# Patient Record
Sex: Female | Born: 1965
Health system: Southern US, Community
[De-identification: ages and names within clinical notes are randomized; demographics above are authoritative.]

## PROBLEM LIST (undated history)

## (undated) DIAGNOSIS — Z01419 Encounter for gynecological examination (general) (routine) without abnormal findings: Secondary | ICD-10-CM

## (undated) DIAGNOSIS — Z13220 Encounter for screening for lipoid disorders: Secondary | ICD-10-CM

## (undated) DIAGNOSIS — Z9889 Other specified postprocedural states: Secondary | ICD-10-CM

## (undated) DIAGNOSIS — Z973 Presence of spectacles and contact lenses: Secondary | ICD-10-CM

## (undated) DIAGNOSIS — N6019 Diffuse cystic mastopathy of unspecified breast: Secondary | ICD-10-CM

## (undated) DIAGNOSIS — M1990 Unspecified osteoarthritis, unspecified site: Secondary | ICD-10-CM

## (undated) DIAGNOSIS — Z9289 Personal history of other medical treatment: Secondary | ICD-10-CM

## (undated) DIAGNOSIS — E559 Vitamin D deficiency, unspecified: Secondary | ICD-10-CM

## (undated) DIAGNOSIS — R112 Nausea with vomiting, unspecified: Secondary | ICD-10-CM

## (undated) DIAGNOSIS — N879 Dysplasia of cervix uteri, unspecified: Secondary | ICD-10-CM

## (undated) DIAGNOSIS — Z711 Person with feared health complaint in whom no diagnosis is made: Secondary | ICD-10-CM

## (undated) HISTORY — DX: Personal history of other medical treatment: Z92.89

## (undated) HISTORY — DX: Vitamin D deficiency, unspecified: E55.9

## (undated) HISTORY — DX: Encounter for screening for lipoid disorders: Z13.220

## (undated) HISTORY — PX: BREAST BIOPSY: SHX20

## (undated) HISTORY — DX: Encounter for gynecological examination (general) (routine) without abnormal findings: Z01.419

## (undated) HISTORY — DX: Person with feared health complaint in whom no diagnosis is made: Z71.1

## (undated) HISTORY — DX: Diffuse cystic mastopathy of unspecified breast: N60.19

---

## 1998-02-12 ENCOUNTER — Other Ambulatory Visit: Admission: RE | Admit: 1998-02-12 | Discharge: 1998-02-12 | Payer: Self-pay | Admitting: Internal Medicine

## 1998-08-30 ENCOUNTER — Other Ambulatory Visit: Admission: RE | Admit: 1998-08-30 | Discharge: 1998-08-30 | Payer: Self-pay | Admitting: Obstetrics and Gynecology

## 1999-03-22 ENCOUNTER — Inpatient Hospital Stay (HOSPITAL_COMMUNITY): Admission: AD | Admit: 1999-03-22 | Discharge: 1999-03-25 | Payer: Self-pay | Admitting: Obstetrics and Gynecology

## 1999-03-26 ENCOUNTER — Encounter (HOSPITAL_COMMUNITY): Admission: RE | Admit: 1999-03-26 | Discharge: 1999-06-24 | Payer: Self-pay | Admitting: Obstetrics and Gynecology

## 2001-07-27 ENCOUNTER — Inpatient Hospital Stay (HOSPITAL_COMMUNITY): Admission: AD | Admit: 2001-07-27 | Discharge: 2001-07-27 | Payer: Self-pay | Admitting: Obstetrics and Gynecology

## 2001-10-15 ENCOUNTER — Inpatient Hospital Stay (HOSPITAL_COMMUNITY): Admission: AD | Admit: 2001-10-15 | Discharge: 2001-10-17 | Payer: Self-pay | Admitting: Internal Medicine

## 2001-11-23 ENCOUNTER — Other Ambulatory Visit: Admission: RE | Admit: 2001-11-23 | Discharge: 2001-11-23 | Payer: Self-pay | Admitting: Obstetrics & Gynecology

## 2002-12-23 ENCOUNTER — Other Ambulatory Visit: Admission: RE | Admit: 2002-12-23 | Discharge: 2002-12-23 | Payer: Self-pay | Admitting: Obstetrics & Gynecology

## 2003-12-26 ENCOUNTER — Other Ambulatory Visit: Admission: RE | Admit: 2003-12-26 | Discharge: 2003-12-26 | Payer: Self-pay | Admitting: Obstetrics & Gynecology

## 2004-03-12 ENCOUNTER — Ambulatory Visit (HOSPITAL_COMMUNITY): Admission: RE | Admit: 2004-03-12 | Discharge: 2004-03-12 | Payer: Self-pay | Admitting: Obstetrics & Gynecology

## 2004-11-20 ENCOUNTER — Encounter: Admission: RE | Admit: 2004-11-20 | Discharge: 2004-11-20 | Payer: Self-pay | Admitting: Family Medicine

## 2005-01-27 ENCOUNTER — Other Ambulatory Visit: Admission: RE | Admit: 2005-01-27 | Discharge: 2005-01-27 | Payer: Self-pay | Admitting: Obstetrics & Gynecology

## 2005-08-18 HISTORY — PX: BREAST SURGERY: SHX581

## 2005-12-15 ENCOUNTER — Encounter: Admission: RE | Admit: 2005-12-15 | Discharge: 2005-12-15 | Payer: Self-pay | Admitting: Family Medicine

## 2005-12-23 ENCOUNTER — Encounter: Admission: RE | Admit: 2005-12-23 | Discharge: 2005-12-23 | Payer: Self-pay | Admitting: Family Medicine

## 2006-01-02 ENCOUNTER — Encounter: Admission: RE | Admit: 2006-01-02 | Discharge: 2006-01-02 | Payer: Self-pay | Admitting: Family Medicine

## 2006-01-06 ENCOUNTER — Encounter: Admission: RE | Admit: 2006-01-06 | Discharge: 2006-01-06 | Payer: Self-pay | Admitting: Family Medicine

## 2006-02-26 ENCOUNTER — Ambulatory Visit: Payer: Self-pay | Admitting: Family Medicine

## 2006-02-26 ENCOUNTER — Other Ambulatory Visit: Admission: RE | Admit: 2006-02-26 | Discharge: 2006-02-26 | Payer: Self-pay | Admitting: Family Medicine

## 2006-03-18 HISTORY — PX: BREAST LUMPECTOMY W/ NEEDLE LOCALIZATION: SHX1266

## 2006-03-30 ENCOUNTER — Encounter (INDEPENDENT_AMBULATORY_CARE_PROVIDER_SITE_OTHER): Payer: Self-pay | Admitting: Specialist

## 2006-03-30 ENCOUNTER — Encounter: Admission: RE | Admit: 2006-03-30 | Discharge: 2006-03-30 | Payer: Self-pay | Admitting: General Surgery

## 2006-03-30 ENCOUNTER — Ambulatory Visit (HOSPITAL_BASED_OUTPATIENT_CLINIC_OR_DEPARTMENT_OTHER): Admission: RE | Admit: 2006-03-30 | Discharge: 2006-03-30 | Payer: Self-pay | Admitting: General Surgery

## 2006-10-26 ENCOUNTER — Ambulatory Visit: Payer: Self-pay | Admitting: Family Medicine

## 2006-12-01 ENCOUNTER — Ambulatory Visit: Payer: Self-pay | Admitting: Family Medicine

## 2007-04-05 ENCOUNTER — Encounter: Admission: RE | Admit: 2007-04-05 | Discharge: 2007-04-05 | Payer: Self-pay | Admitting: Family Medicine

## 2007-05-26 ENCOUNTER — Ambulatory Visit: Payer: Self-pay | Admitting: Family Medicine

## 2007-09-24 ENCOUNTER — Ambulatory Visit: Payer: Self-pay | Admitting: Family Medicine

## 2008-04-06 ENCOUNTER — Encounter: Admission: RE | Admit: 2008-04-06 | Discharge: 2008-04-06 | Payer: Self-pay | Admitting: Family Medicine

## 2008-06-06 ENCOUNTER — Ambulatory Visit: Payer: Self-pay | Admitting: Family Medicine

## 2008-08-23 ENCOUNTER — Ambulatory Visit: Payer: Self-pay | Admitting: Family Medicine

## 2009-04-12 ENCOUNTER — Encounter: Admission: RE | Admit: 2009-04-12 | Discharge: 2009-04-12 | Payer: Self-pay | Admitting: Family Medicine

## 2009-07-25 ENCOUNTER — Ambulatory Visit: Payer: Self-pay | Admitting: Family Medicine

## 2009-12-18 ENCOUNTER — Ambulatory Visit: Payer: Self-pay | Admitting: Physician Assistant

## 2010-03-18 HISTORY — PX: DIAGNOSTIC MAMMOGRAM: HXRAD719

## 2010-04-15 ENCOUNTER — Encounter: Admission: RE | Admit: 2010-04-15 | Discharge: 2010-04-15 | Payer: Self-pay | Admitting: Family Medicine

## 2010-06-18 DIAGNOSIS — Z87410 Personal history of cervical dysplasia: Secondary | ICD-10-CM

## 2010-06-18 HISTORY — DX: Personal history of cervical dysplasia: Z87.410

## 2010-06-24 ENCOUNTER — Ambulatory Visit: Payer: Self-pay | Admitting: Physician Assistant

## 2010-07-15 ENCOUNTER — Ambulatory Visit: Payer: Self-pay | Admitting: Family Medicine

## 2010-09-05 ENCOUNTER — Ambulatory Visit
Admission: RE | Admit: 2010-09-05 | Discharge: 2010-09-05 | Payer: Self-pay | Source: Home / Self Care | Attending: Family Medicine | Admitting: Family Medicine

## 2010-09-08 ENCOUNTER — Encounter: Payer: Self-pay | Admitting: General Surgery

## 2011-01-03 NOTE — Op Note (Signed)
[name], Kristina Newton                ACCOUNT NO.:  [id number]   MEDICAL RECORD NO.:  [id number]          PATIENT TYPE:  AMB   LOCATION:  DSC                          FACILITY:  [facility]   PHYSICIAN:  Ollen Gross. Vernell Morgans, M.D. DATE OF BIRTH:  [birth year]   DATE OF PROCEDURE:  DATE OF DISCHARGE:                                 OPERATIVE REPORT   PREOPERATIVE DIAGNOSIS:  Right breast calcifications.   POSTOPERATIVE DIAGNOSIS:  Right breast calcifications.   PROCEDURES PERFORMED:  Right breast needle localized lumpectomy.   SURGEON:  Ollen Gross. Carolynne Edouard, M.D.   ANESTHESIA:  General via LMA.   PROCEDURE:  After informed consent was obtained the patient was brought to  the operating room and placed in the supine position on the operating room  table.  After adequate induction of general anesthesia the patient's right  breast was prepped with Betadine and draped in the usual sterile manner.  Earlier in the day the patient had undergone a wire localization procedure,  and the wire was entering the breast laterally at the level of the nipple.  A small radial incision was made from the edge of the areola to the wire  with a 15-blade knife.  This incision was carried down through the skin and  subcutaneous tissue sharply with the electrocautery.  A small circular area  of breast tissue around the tip of the wire was removed sharply with the  electrocautery.  This was then marked with the long stitch being lateral and  the short stitch being superior and sent initially to Radiology where x-rays  revealed the calcifications to be within the specimen.  The specimen was  then sent to pathology for further evaluation.  Hemostasis was achieved  using the Bovie electrocautery.  The wound was irrigated with copious  amounts of saline.  The wound was infiltrated with 0.25% Marcaine.  The deep  layer of the wound was then closed with interrupted 3-0 Vicryl stitches, and  the skin was closed with a running 4-0  Monocryl subcuticular stitch.  Benzoin, Steri-Strips and sterile dressings were applied.  The patient  tolerated the procedure well.  At the end of the case all needle, sponge and  instrument counts were correct.  The patient was then awakened and taken to  the recovery room in stable condition.      Ollen Gross. Vernell Morgans, M.D.  Electronically Signed     PST/MEDQ  D:  03/30/2006  T:  03/31/2006  Job:  [id number]

## 2011-01-14 ENCOUNTER — Ambulatory Visit (INDEPENDENT_AMBULATORY_CARE_PROVIDER_SITE_OTHER): Payer: BC Managed Care – PPO | Admitting: Medical

## 2011-01-14 ENCOUNTER — Encounter: Payer: Self-pay | Admitting: Medical

## 2011-01-14 DIAGNOSIS — Z0000 Encounter for general adult medical examination without abnormal findings: Secondary | ICD-10-CM

## 2011-01-14 LAB — CBC WITH DIFFERENTIAL/PLATELET
Basophils Absolute: 0 10*3/uL (ref 0.0–0.1)
Basophils Relative: 1 % (ref 0–1)
Eosinophils Absolute: 0.1 10*3/uL (ref 0.0–0.7)
Hemoglobin: 13.3 g/dL (ref 12.0–15.0)
Lymphocytes Relative: 26 % (ref 12–46)
Lymphs Abs: 1.1 10*3/uL (ref 0.7–4.0)
MCHC: 32.1 g/dL (ref 30.0–36.0)
Platelets: 282 10*3/uL (ref 150–400)
RBC: 4.44 MIL/uL (ref 3.87–5.11)
WBC: 4.4 10*3/uL (ref 4.0–10.5)

## 2011-01-14 LAB — LIPID PANEL
LDL Cholesterol: 140 mg/dL — ABNORMAL HIGH (ref 0–99)
Triglycerides: 92 mg/dL (ref <150)
VLDL: 18 mg/dL (ref 0–40)

## 2011-01-14 LAB — POCT URINALYSIS DIPSTICK
Blood, UA: NEGATIVE
Glucose, UA: NEGATIVE
Leukocytes, UA: NEGATIVE
Nitrite, UA: NEGATIVE
Urobilinogen, UA: NEGATIVE

## 2011-01-14 LAB — COMPREHENSIVE METABOLIC PANEL
Albumin: 4.6 g/dL (ref 3.5–5.2)
CO2: 30 mEq/L (ref 19–32)
Calcium: 9.7 mg/dL (ref 8.4–10.5)
Chloride: 102 mEq/L (ref 96–112)
Potassium: 4.3 mEq/L (ref 3.5–5.3)
Sodium: 138 mEq/L (ref 135–145)

## 2011-01-14 NOTE — Patient Instructions (Signed)

## 2011-01-14 NOTE — Progress Notes (Signed)
Subjective:   HPI  Kristina Newton is a 45 y.o. female who presents for physical.  In general she notes that she is eating mostly healthy, occasional splurging.  Has done marathons in the past, but hasn't been running as much lately.  Has hx/o elevated LDL, but wants to avoid medication if possible.  No other heart disease risk factors other than elevated LDL.  Otherwise been in normal state of health, no other c/o.   Past Medical History  Diagnosis Date  . Hyperlipidemia   . Vitamin D insufficiency   . Fibrocystic breast disease   . Concern about skin cancer without diagnosis     regular surveillance with dermatology, [facility]  . Gynecologic exam normal     sees Dr. Ilda Mori    Review of Systems Constitutional: denies fever, chills, sweats, unexpected weight change, anorexia, fatigue Allergy: negative; denies recent sneezing, itching, congestion Dermatology: denies changing moles, rash, lumps, new worrisome lesions ENT: no runny nose, ear pain, sore throat, hoarseness, sinus pain, teeth pain, tinnitus, hearing loss, epistaxis Cardiology: denies chest pain, palpitations, edema, orthopnea, paroxysmal nocturnal dyspnea Respiratory: denies cough, shortness of breath, dyspnea on exertion, wheezing, hemoptysis Gastroenterology: denies abdominal pain, nausea, vomiting, diarrhea, constipation, blood in stool, changes in bowel movement, dysphagia Hematology: denies bleeding or bruising problems Musculoskeletal: denies arthralgias, myalgias, joint swelling, back pain, neck pain, cramping, gait changes Ophthalmology: denies vision changes, eye redness, itching, discharge Urology: denies dysuria, difficulty urinating, hematuria, urinary frequency, urgency, incontinence Neurology: no headache, weakness, tingling, numbness, speech abnormality, memory loss, falls, dizziness Psychology: denies depressed mood, agitation, sleep problems     Objective:   Physical Exam  General  appearance: alert, no distress, WD/WN,white  female , looks younger than stated age Skin: scattered benign appearing macules of torso HEENT: normocephalic, conjunctiva/corneas normal, sclerae anicteric, PERRLA, EOMi, nares patent, no discharge or erythema, pharynx normal Oral cavity: MMM, tongue normal, teeth normal Neck: supple, no lymphadenopathy, no thyromegaly, no masses, normal ROM Chest: non tender, normal shape and expansion Heart: RRR, normal S1, S2, no murmurs Lungs: CTA bilaterally, no wheezes, rhonchi, or rales Abdomen: +bs, soft, non tender, non distended, no masses, no hepatomegaly, no splenomegaly, no bruits Back: non tender, normal ROM, no scoliosis Musculoskeletal: upper extremities non tender, no obvious deformity, normal ROM throughout, lower extremities non tender, no obvious deformity, normal ROM throughout Extremities: no edema, no cyanosis, no clubbing Pulses: 2+ symmetric, upper and lower extremities, normal cap refill Neurological: alert, oriented x 3, CN2-12 intact, strength normal upper extremities and lower extremities, sensation normal throughout, DTRs 2+ throughout, no cerebellar signs, gait normal Psychiatric: normal affect, behavior normal, pleasant  Breast and pelvic - deferred to gynecology   Assessment :    Encounter Diagnosis  Name Primary?  . General medical examination Yes      Plan:    Discussed healthy lifestyle, preventative care, handout given on well visit info.   She sees dermatology yearly for surveillance, sees gynecology regularly for preventative care.  We will call with lab results.

## 2011-01-16 ENCOUNTER — Telehealth: Payer: Self-pay | Admitting: Medical

## 2011-01-16 DIAGNOSIS — R748 Abnormal levels of other serum enzymes: Secondary | ICD-10-CM

## 2011-01-16 NOTE — Telephone Encounter (Signed)
I spoke to pt about her lab results.  CBC, UA, TSH, lytes, kidney function all normal.  LDL and total chol slightly elevated.  Her AST was elevated, but ALT normally.  She does drink some alcohol and takes Tylenol, but not in excess.  Advised she use alcohol in moderation, she will come by for nurse visit for liver profile in 73mo.

## 2011-03-12 ENCOUNTER — Other Ambulatory Visit: Payer: Self-pay | Admitting: Family Medicine

## 2011-03-12 DIAGNOSIS — Z1231 Encounter for screening mammogram for malignant neoplasm of breast: Secondary | ICD-10-CM

## 2011-04-18 ENCOUNTER — Ambulatory Visit
Admission: RE | Admit: 2011-04-18 | Discharge: 2011-04-18 | Disposition: A | Discharge status: Home / Self Care | Payer: BC Managed Care – PPO | Source: Ambulatory Visit | Attending: Family Medicine | Admitting: Family Medicine

## 2011-04-18 DIAGNOSIS — Z1231 Encounter for screening mammogram for malignant neoplasm of breast: Secondary | ICD-10-CM

## 2011-07-14 ENCOUNTER — Other Ambulatory Visit (INDEPENDENT_AMBULATORY_CARE_PROVIDER_SITE_OTHER): Payer: BC Managed Care – PPO

## 2011-07-14 DIAGNOSIS — Z23 Encounter for immunization: Secondary | ICD-10-CM

## 2011-07-14 DIAGNOSIS — Z111 Encounter for screening for respiratory tuberculosis: Secondary | ICD-10-CM

## 2011-07-14 MED ORDER — TUBERCULIN PPD 5 UNIT/0.1ML ID SOLN: 5.0000 [IU] | Freq: Once | INTRADERMAL | Status: DC

## 2011-10-13 ENCOUNTER — Ambulatory Visit (INDEPENDENT_AMBULATORY_CARE_PROVIDER_SITE_OTHER): Payer: BC Managed Care – PPO | Admitting: Medical

## 2011-10-13 ENCOUNTER — Encounter: Payer: Self-pay | Admitting: Medical

## 2011-10-13 ENCOUNTER — Encounter: Payer: Self-pay | Admitting: Internal Medicine

## 2011-10-13 VITALS — BP 122/70 | HR 60 | Temp 98.4°F | Resp 18 | Wt 120.0 lb

## 2011-10-13 DIAGNOSIS — R7401 Elevation of levels of liver transaminase levels: Secondary | ICD-10-CM

## 2011-10-13 DIAGNOSIS — R748 Abnormal levels of other serum enzymes: Secondary | ICD-10-CM

## 2011-10-13 DIAGNOSIS — R7989 Other specified abnormal findings of blood chemistry: Secondary | ICD-10-CM

## 2011-10-13 DIAGNOSIS — R0789 Other chest pain: Secondary | ICD-10-CM

## 2011-10-13 DIAGNOSIS — R799 Abnormal finding of blood chemistry, unspecified: Secondary | ICD-10-CM

## 2011-10-13 NOTE — Progress Notes (Signed)
Subjective: Here today with c/o chest pressure.  She is a very active woman, exercises about 4 days per week including Yoga, high intensity aerobics, and weights.  She notes this past few weeks with a few random episodes of chest discomfort under the sternum.  She felt the last episode this past Friday while watching TV at rest.  The discomfort lasts for hours without associated SOB, nausea, diaphoresis, or paresthesias.  The pain doesn't radiate.  She denies heartburn, regurgitation, belching, gas, jaw pain, arm pain, nausea or vomiting.  She also notes some vision changes in the past 4 months, progressively worsening.  Vision isn't as crisp as in the past.  Denies vision loss, floaters, or eye pain.  She notes hx/o Lasik 2000.   After experiencing the chest discomfort, she began researching the symptoms online, and has gotten somewhat panicky thinking about the chest discomfort.  She does note that she routinely eats tangerines, tomato based sauces such as pasta, and foods containing beans and onions.   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  Denies problems with BP in general.  She was seen here in the past few years for unrelated c/o, had a concern on EKG, and ultimately was seen by cardiology and had echocardiogram.  Everything came back normal per her report.    Past Medical History  Diagnosis Date  . Hyperlipidemia   . Vitamin D insufficiency   . Fibrocystic breast disease   . Concern about skin cancer without diagnosis     regular surveillance with dermatology, [facility]  . Gynecologic exam normal     sees Dr. Ilda Mori   ROS: Gen: no fever, chills, sweats, weight changes Skin: no rash HEENT: negative Heart: no CP, palpitations, edema Lungs: no SOB, wheezing, hemoptysis, no recent long travel, recent procedure or surgery, no stasis, no hx/o DVT/PE GI: no pain, NVD or constipation GU: negative Neuro: no headache, numbness,  tingling, weakness, speech changes, falls  Pertinent family history: Father with heart disease, but over 80yo, and diagnosed in later life.    Objective:   Physical Exam  Filed Vitals:   10/13/11 1351  BP: 122/70  Pulse: 60  Temp: 98.4 F (36.9 C)  Resp: 18    General appearance: alert, no distress, WD/WN, lean white female HEENT: normocephalic, sclerae anicteric, TMs pearly, nares patent, no discharge or erythema, pharynx normal Oral cavity: MMM, no lesions Neck: supple, no lymphadenopathy, no thyromegaly, no masses Chest wall nontender except lower sternum focally Heart: RRR, normal S1, S2, no murmurs Lungs: CTA bilaterally, no wheezes, rhonchi, or rales Abdomen: +bs, soft, non tender, non distended, no masses, no hepatomegaly, no splenomegaly Pulses: 2+ symmetric, upper and lower extremities, normal cap refill Ext: no edema, cyanosis or clubbing   Adult ECG Report  Indication: chest discomfort  Rate: 55 bpm  Rhythm: sinus bradycardia  QRS Axis: 58 degrees  PR Interval:  QRS Duration: 86ms  QTc:  Conduction Disturbances: none  Other Abnormalities: none  Patient's cardiac risk factors are: borderline hyperlipidemia.  EKG comparison: 05/2008  Narrative Interpretation: sinus bradycardia, otherwise normal and unchanged in any significant way from 2009 EKG    Assessment and Plan :    Encounter Diagnoses  Name Primary?  . Chest discomfort Yes  . Elevated liver enzymes   . Abnormal blood chemistry    Advised that her symptoms are atypical for cardiac origin, particularly in light of her good  health, exercise routine without symptoms.  She is eating/drinking things that are GERD triggers.  I advised she avoid trigger foods, begin her Pepcid or OTC Prilosec BID for the next few days and see if symptoms resolve.  If worse or not improving, call or return.  She signed records release to obtain prior echo and cardiology notes.  Call report 2-3 days.  Elevated  LFT and abnormal blood chemistry back in May on her physical.  She will return soon for labs.

## 2011-10-14 ENCOUNTER — Telehealth: Payer: Self-pay | Admitting: Family Medicine

## 2011-10-14 ENCOUNTER — Encounter: Payer: Self-pay | Admitting: Medical

## 2011-10-14 NOTE — Telephone Encounter (Signed)
Pt returned your call and would like you to call her back at [phone]

## 2011-10-15 ENCOUNTER — Telehealth: Payer: Self-pay | Admitting: Medical

## 2011-10-15 NOTE — Telephone Encounter (Signed)
I spoke to pt.  She used Zantac last night and the pain is all gone.    I spoke to her about lipids in the past.  She will consider Niacin or Fenofibrate or statin to lower her cardiac risk, c/t Vit D, and consider 81mg  baby aspirin daily.  She will return soon for f/u labs - ordered in system as standing order for liver and hgba1c

## 2011-10-15 NOTE — Telephone Encounter (Signed)
Message copied by Jac Canavan on Wed Oct 15, 2011  7:46 AM ------      Message from: Jac Canavan      Created: Tue Oct 14, 2011  7:49 AM       Call and make sure she lets [place] know in a few days if the Pepcid or OTC Prilosec helps the chest discomfort.  I want her to avoid the acidic and gas causing foods we discussed for the next week to make sure symptoms resolve.            I went back and looked in her chart regarding cholesterol and abnormal labs from 12/2010.   I need her to return at her convenience to recheck liver test and diabetic screen called the HgbA1C (orders are in computer standing).            Regarding her cholesterol - her total chol has been elevated on more than 1 occasion.  She had a special test to check her lipid particle size back in 12/2009. That test showed her small LDL particles to be less than optimal and vit D was low.  She is healthy, exercises regularly and is normal weight with normal BP here.  If she wanted to further reduce her heart disease risk, I would recommend she consider these 3 things - daily Vit D such as [phone] IU daily, consider starting either Niacin or Fibrate prescription drug to modify her particle size which would also bring down her LDL and triglycerides further, and consider a daily baby Aspirin 81mg  for heart health.  See what her thoughts are?

## 2012-05-10 ENCOUNTER — Other Ambulatory Visit: Payer: Self-pay | Admitting: Family Medicine

## 2012-05-10 DIAGNOSIS — Z1231 Encounter for screening mammogram for malignant neoplasm of breast: Secondary | ICD-10-CM

## 2012-05-27 ENCOUNTER — Ambulatory Visit
Admission: RE | Admit: 2012-05-27 | Discharge: 2012-05-27 | Disposition: A | Discharge status: Home / Self Care | Payer: BC Managed Care – PPO | Source: Ambulatory Visit | Attending: Family Medicine | Admitting: Family Medicine

## 2012-05-27 DIAGNOSIS — Z1231 Encounter for screening mammogram for malignant neoplasm of breast: Secondary | ICD-10-CM

## 2012-06-15 ENCOUNTER — Ambulatory Visit (INDEPENDENT_AMBULATORY_CARE_PROVIDER_SITE_OTHER): Payer: BC Managed Care – PPO | Admitting: Medical

## 2012-06-15 ENCOUNTER — Encounter: Payer: Self-pay | Admitting: Medical

## 2012-06-15 VITALS — BP 120/80 | HR 58 | Temp 98.0°F | Resp 16 | Ht 64.0 in | Wt 121.0 lb

## 2012-06-15 DIAGNOSIS — Z0000 Encounter for general adult medical examination without abnormal findings: Secondary | ICD-10-CM

## 2012-06-15 DIAGNOSIS — E785 Hyperlipidemia, unspecified: Secondary | ICD-10-CM

## 2012-06-15 DIAGNOSIS — E559 Vitamin D deficiency, unspecified: Secondary | ICD-10-CM

## 2012-06-15 NOTE — Progress Notes (Signed)
Subjective:   HPI  Kristina Newton is a 46 y.o. female who presents for physical.  In general she notes that she is eating healthy, exercises regularly.  Has hx/o elevated LDL but no other heart disease risk factors other than elevated LDL.  Otherwise been in normal state of health, no other c/o.  Sees gynecology yearly, eye doctor regularly, dentist regularly.  No particular c/o.    Allergies  Allergen Reactions  . Penicillins     No current outpatient prescriptions on file prior to visit.    Past Medical History  Diagnosis Date  . Hyperlipidemia   . Vitamin D insufficiency   . Fibrocystic breast disease   . Concern about skin cancer without diagnosis     regular surveillance with dermatology, [facility]  . Gynecologic exam normal     sees Dr. Ilda Mori  . History of mammogram 05/27/2012    Past Surgical History  Procedure Date  . Breast surgery 2007    breast biopsy  . Diagnostic mammogram 8/11    normal    Family History  Problem Relation Age of Onset  . Cancer Mother     basal cell cancer  . Cancer Father     basal cell cancer  . Hypertension Father   . Heart disease Father     arrythmia  . Depression Sister   . Stroke Neg Hx   . Diabetes Neg Hx     History   Social History  . Marital Status: Married    Spouse Name: N/A    Number of Children: N/A  . Years of Education: N/A   Occupational History  . Not on file.   Social History Main Topics  . Smoking status: Never Smoker   . Smokeless tobacco: Never Used  . Alcohol Use: 2.5 oz/week    5 drink(s) per week  . Drug Use: No  . 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 Not on file   Social History Narrative   Aerobics, Runner, broadcasting/film/video,  Married, 2 children, ages 88yo and 62yo.  Works in Airline pilot for [employer]    Reviewed their medical, surgical, family,  social, medication, and allergy history and updated chart as appropriate.   Review of Systems Constitutional: -fever, -chills, -sweats, -unexpected weight change, -anorexia, -fatigue Allergy: -sneezing, -itching, -congestion Dermatology: denies changing moles, rash, lumps, new worrisome lesions ENT: -runny nose, -ear pain, -sore throat, -hoarseness, -sinus pain, -teeth pain, -tinnitus, -hearing loss, -epistaxis Cardiology:  -chest pain, -palpitations, -edema, -orthopnea, -paroxysmal nocturnal dyspnea Respiratory: -cough, -shortness of breath, -dyspnea on exertion, -wheezing, -hemoptysis Gastroenterology: -abdominal pain, -nausea, -vomiting, -diarrhea, -constipation, -blood in stool, -changes in bowel movement, -dysphagia Hematology: -bleeding or bruising problems Musculoskeletal: -arthralgias, -myalgias, -joint swelling, -back pain, -neck pain, -cramping, -gait changes Ophthalmology: -vision changes, -eye redness, -itching, -discharge Urology: -dysuria, -difficulty urinating, -hematuria, -urinary frequency, -urgency, incontinence Neurology: -headache, -weakness, -tingling, -numbness, -speech abnormality, -memory loss, -falls, -dizziness Psychology:  -depressed mood, -agitation, -sleep problems     Objective:   Physical Exam  General appearance: alert, no distress, WD/WN,white  female , looks younger than stated age Skin: scattered benign appearing macules of torso HEENT: normocephalic, conjunctiva/corneas normal, sclerae anicteric, PERRLA, EOMi, nares patent, no discharge or erythema, pharynx normal Oral cavity: MMM, tongue normal, teeth normal Neck: supple, no lymphadenopathy, no thyromegaly, no masses, normal ROM,  no bruits Chest: non tender, normal shape and expansion Heart: RRR, normal S1, S2, no murmurs Lungs: CTA bilaterally, no wheezes, rhonchi, or rales Abdomen: +bs, soft, non tender, non distended, no masses, no hepatomegaly, no splenomegaly, no bruits Back: non tender, normal  ROM, no scoliosis Musculoskeletal: upper extremities non tender, no obvious deformity, normal ROM throughout, lower extremities non tender, no obvious deformity, normal ROM throughout Extremities: no edema, no cyanosis, no clubbing Pulses: 2+ symmetric, upper and lower extremities, normal cap refill Neurological: alert, oriented x 3, CN2-12 intact, strength normal upper extremities and lower extremities, sensation normal throughout, DTRs 2+ throughout, no cerebellar signs, gait normal Psychiatric: normal affect, behavior normal, pleasant  Breast and pelvic - deferred to gynecology   Assessment :    Encounter Diagnoses  Name Primary?  . Routine general medical examination at a health care facility Yes  . Hyperlipidemia   . Vitamin D deficiency      Plan:    Discussed healthy lifestyle, preventative care, handout given.   She sees dermatology yearly for surveillance, sees gynecology regularly for preventative care.  We will call with lab results.  She just had flu vaccine at work.    Hyperlipidemia - LDL particles elevated on prior lipo profile.  We will check a lipo profile today for comparison.  She is otherwise healthy without other risk factors.    Vit D deficiency - not currently on Vit D, but is taking MV.   Labs today.   F/u pending labs

## 2012-06-15 NOTE — Patient Instructions (Signed)

## 2012-06-16 LAB — CBC WITH DIFFERENTIAL/PLATELET
Lymphocytes Relative: 27 % (ref 12–46)
Lymphs Abs: 1.1 10*3/uL (ref 0.7–4.0)
MCH: 30.5 pg (ref 26.0–34.0)
MCHC: 33.1 g/dL (ref 30.0–36.0)
MCV: 92.1 fL (ref 78.0–100.0)
Monocytes Absolute: 0.4 10*3/uL (ref 0.1–1.0)
Neutrophils Relative %: 59 % (ref 43–77)

## 2012-06-16 LAB — POCT URINALYSIS DIPSTICK
Blood, UA: NEGATIVE
Glucose, UA: NEGATIVE
Ketones, UA: NEGATIVE
Spec Grav, UA: <=1.005
Urobilinogen, UA: NEGATIVE

## 2012-06-16 LAB — COMPREHENSIVE METABOLIC PANEL
ALT: 8 U/L (ref 0–35)
AST: 15 U/L (ref 0–37)
Creat: 0.99 mg/dL (ref 0.50–1.10)
Potassium: 4.4 mEq/L (ref 3.5–5.3)
Total Bilirubin: 0.4 mg/dL (ref 0.3–1.2)
Total Protein: 6.5 g/dL (ref 6.0–8.3)

## 2012-06-16 NOTE — Addendum Note (Signed)
Addended by: Janeice Robinson on: 06/16/2012 04:40 PM   Modules accepted: Orders

## 2012-06-17 LAB — NMR LIPOPROFILE WITH LIPIDS
Cholesterol, Total: 238 mg/dL — ABNORMAL HIGH (ref <200)
HDL-C: 59 mg/dL (ref >=40)
LDL Particle Number: 1625 nmol/L — ABNORMAL HIGH (ref <1000)
LDL Size: 21.4 nm (ref >20.5)
Triglycerides: 94 mg/dL (ref <150)

## 2012-06-23 ENCOUNTER — Other Ambulatory Visit: Payer: Self-pay | Admitting: Medical

## 2012-06-23 MED ORDER — SIMVASTATIN 10 MG PO TABS: 10.0000 mg | ORAL_TABLET | Freq: Every day | ORAL | Status: DC

## 2012-06-30 ENCOUNTER — Other Ambulatory Visit: Payer: BC Managed Care – PPO

## 2012-08-23 ENCOUNTER — Other Ambulatory Visit (INDEPENDENT_AMBULATORY_CARE_PROVIDER_SITE_OTHER): Payer: BC Managed Care – PPO

## 2012-08-23 DIAGNOSIS — Z111 Encounter for screening for respiratory tuberculosis: Secondary | ICD-10-CM

## 2012-08-25 LAB — TB SKIN TEST
Induration: 0 mm
TB Skin Test: NEGATIVE

## 2012-10-02 ENCOUNTER — Other Ambulatory Visit: Payer: Self-pay

## 2012-10-20 ENCOUNTER — Telehealth: Payer: Self-pay | Admitting: Family Medicine

## 2012-10-20 NOTE — Telephone Encounter (Signed)
I left a message on her voice mail about scheduling her follow up appointment for fasting labs. CLS

## 2012-10-20 NOTE — Telephone Encounter (Signed)
Message copied by Janeice Robinson on Wed Oct 20, 2012 10:33 AM ------      Message from: Aleen Campi, DAVID S      Created: Wed Oct 20, 2012  4:17 AM       I believe we started her on cholesterol lowering medication after the lipo profile back in October.  She is due for repeat fasting lipoprofile and liver tests at this time.  pls schedule f/u. ------

## 2013-04-28 ENCOUNTER — Other Ambulatory Visit: Payer: Self-pay

## 2013-04-28 DIAGNOSIS — Z1231 Encounter for screening mammogram for malignant neoplasm of breast: Secondary | ICD-10-CM

## 2013-05-30 ENCOUNTER — Ambulatory Visit
Admission: RE | Admit: 2013-05-30 | Discharge: 2013-05-30 | Disposition: A | Discharge status: Home / Self Care | Payer: BC Managed Care – PPO | Source: Ambulatory Visit

## 2013-05-30 DIAGNOSIS — Z1231 Encounter for screening mammogram for malignant neoplasm of breast: Secondary | ICD-10-CM

## 2013-06-23 ENCOUNTER — Other Ambulatory Visit: Payer: Self-pay

## 2013-07-04 ENCOUNTER — Encounter: Payer: Self-pay | Admitting: Medical

## 2013-07-04 ENCOUNTER — Ambulatory Visit (INDEPENDENT_AMBULATORY_CARE_PROVIDER_SITE_OTHER): Payer: BC Managed Care – PPO | Admitting: Medical

## 2013-07-04 VITALS — BP 92/70 | HR 62 | Temp 97.8°F | Resp 16 | Ht 64.0 in | Wt 113.0 lb

## 2013-07-04 DIAGNOSIS — E7800 Pure hypercholesterolemia, unspecified: Secondary | ICD-10-CM

## 2013-07-04 DIAGNOSIS — E559 Vitamin D deficiency, unspecified: Secondary | ICD-10-CM

## 2013-07-04 DIAGNOSIS — Z0000 Encounter for general adult medical examination without abnormal findings: Secondary | ICD-10-CM

## 2013-07-04 NOTE — Progress Notes (Signed)
Subjective:   HPI  Kristina Newton is a 47 y.o. female who presents for a complete physical.  Medical care team includes:  Dermatology, [facility]  Gynecology, Dr. Arlyce Dice  Dentist, Dr. Zigmund Gottron, PA for primary care   Preventative care: Last ophthalmology visit:N/A Last dental visit:YES DR. Vickki Muff Last colonoscopy:N/A Last mammogram:05/2013 Last gynecological exam:2013 AT [facility] Last EKG:09/2011 Last labs: last year  Prior vaccinations: TD or Tdap:08/2010 Influenza:05/2013 AT [facility] Pneumococcal:N/A Shingles/Zostavax:N/A  Advanced directive:N/A Health care power of attorney:N/.A Living will:N/A  Concerns: Hx/o elevated LDL particle numbers on prior NMRs.  Never started statin last year.  Is actively doing triathlon training most days per week.  Very active.  Eating healthy ,small frequent meals.  Is down to her goal weight.  Reviewed their medical, surgical, family, social, medication, and allergy history and updated chart as appropriate.  Past Medical History  Diagnosis Date  . Hyperlipidemia   . Vitamin D insufficiency   . Fibrocystic breast disease   . Concern about skin cancer without diagnosis     regular surveillance with dermatology, [facility]  . Gynecologic exam normal     sees Dr. Ilda Mori  . History of mammogram 05/27/2012    Past Surgical History  Procedure Laterality Date  . Breast surgery  2007    breast biopsy  . Diagnostic mammogram  8/11    normal    History   Social History  . Marital Status: Married    Spouse Name: N/A    Number of Children: N/A  . Years of Education: N/A   Occupational History  . Not on file.   Social History Main Topics  . Smoking status: Never Smoker   . Smokeless tobacco: Never Used  . Alcohol Use: 2.5 oz/week    5 drink(s) per week  . Drug Use: No  . 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 Not on file   Social History Narrative   Aerobics, strength training,  Married, 2 children, ages 49yo and 71yo.  Works in Airline pilot for [employer]    Family History  Problem Relation Age of Onset  . Cancer Mother     basal cell cancer  . Cancer Father     basal cell cancer  . Hypertension Father   . Heart disease Father     arrythmia  . Hyperlipidemia Father   . Depression Sister   . Stroke Neg Hx   . Diabetes Neg Hx     No current outpatient prescriptions on file.  Allergies  Allergen Reactions  . Penicillins     Review of Systems Constitutional: -fever, -chills, -sweats, -unexpected weight change, -decreased appetite, -fatigue Allergy: -sneezing, -itching, -congestion Dermatology: -changing moles, --rash, -lumps ENT: -runny nose, -ear pain, -sore throat, -hoarseness, -sinus pain, -teeth pain, - ringing in ears, -hearing loss, -nosebleeds Cardiology: -chest pain, -palpitations, -swelling, -difficulty breathing when lying flat, -waking up short of breath Respiratory: -cough, -shortness of breath, -difficulty breathing with exercise or exertion, -wheezing, -coughing up blood Gastroenterology: -abdominal pain, -nausea, -vomiting, -diarrhea, -constipation, -blood in stool, -changes in bowel movement, -difficulty swallowing or eating Hematology: -bleeding, -bruising  Musculoskeletal: -joint aches, -muscle aches, -joint swelling, -back pain, -neck pain, -cramping, -changes in gait Ophthalmology: denies vision changes, eye redness, itching, discharge Urology: -burning with urination, -difficulty urinating, -blood in urine, -  urinary frequency, -urgency, -incontinence Neurology: -headache, -weakness, -tingling, -numbness, -memory loss, -falls, -dizziness Psychology: -depressed mood, -agitation, -sleep problems     Objective:   Physical Exam  BP 92/70  Pulse 62  Temp(Src) 97.8 F (36.6 C) (Oral)  Resp 16   Ht 5\' 4"  (1.626 m)  Wt 113 lb (51.256 kg)  BMI 19.39 kg/m2  LMP 01/04/2011  General appearance: alert, no distress, WD/WN,white female, looks younger than stated age  Skin: scattered benign appearing macules of torso  HEENT: normocephalic, conjunctiva/corneas normal, sclerae anicteric, PERRLA, EOMi, nares patent, no discharge or erythema, pharynx normal  Oral cavity: MMM, tongue normal, teeth normal  Neck: supple, no lymphadenopathy, no thyromegaly, no masses, normal ROM, no bruits  Chest: non tender, normal shape and expansion  Heart: RRR, normal S1, S2, no murmurs  Lungs: CTA bilaterally, no wheezes, rhonchi, or rales  Abdomen: +bs, soft, non tender, non distended, no masses, no hepatomegaly, no splenomegaly, no bruits  Back: non tender, normal ROM, no scoliosis  Musculoskeletal: upper extremities non tender, no obvious deformity, normal ROM throughout, lower extremities non tender, no obvious deformity, normal ROM throughout  Extremities: no edema, no cyanosis, no clubbing  Pulses: 2+ symmetric, upper and lower extremities, normal cap refill  Neurological: alert, oriented x 3, CN2-12 intact, strength normal upper extremities and lower extremities, sensation normal throughout, DTRs 2+ throughout, no cerebellar signs, gait normal  Psychiatric: normal affect, behavior normal, pleasant  Breast and pelvic - deferred to gynecology    Assessment and Plan :    Encounter Diagnoses  Name Primary?  . Routine general medical examination at a health care facility Yes  . Pure hypercholesterolemia   . Unspecified vitamin D deficiency     Physical exam - discussed healthy lifestyle, diet, exercise, preventative care, vaccinations, and addressed their concerns.  Handout given. Pure hypercholesterolemia - [facility] today.  Pending labs, consider long term statin therapy. Vit D deficiency - advised she use OTC Vit D   Follow-up pending labs.

## 2013-07-05 LAB — VITAMIN D 25 HYDROXY (VIT D DEFICIENCY, FRACTURES): Vit D, 25-Hydroxy: 37 ng/mL (ref 30–89)

## 2013-07-27 ENCOUNTER — Telehealth: Payer: Self-pay | Admitting: Medical

## 2013-07-27 NOTE — Telephone Encounter (Signed)
Spoke to today. Advised of Shane's instructions

## 2013-07-27 NOTE — Telephone Encounter (Signed)
pls call and let Adileny know her lab booklet is ready for pickup (NMR lipoprofile).  She needs to read over it, and then we can discuss.  There are some specific recommendations in the booklet, but we can discuss options and how to proceed.

## 2013-08-25 ENCOUNTER — Encounter: Payer: Self-pay | Admitting: Medical

## 2013-09-06 ENCOUNTER — Other Ambulatory Visit (INDEPENDENT_AMBULATORY_CARE_PROVIDER_SITE_OTHER): Payer: BC Managed Care – PPO

## 2013-09-06 DIAGNOSIS — Z111 Encounter for screening for respiratory tuberculosis: Secondary | ICD-10-CM

## 2013-09-08 LAB — TB SKIN TEST
Induration: NEGATIVE mm
TB SKIN TEST: NEGATIVE

## 2014-05-08 ENCOUNTER — Other Ambulatory Visit: Payer: Self-pay

## 2014-05-08 DIAGNOSIS — Z1231 Encounter for screening mammogram for malignant neoplasm of breast: Secondary | ICD-10-CM

## 2014-06-06 ENCOUNTER — Ambulatory Visit
Admission: RE | Admit: 2014-06-06 | Discharge: 2014-06-06 | Disposition: A | Discharge status: Home / Self Care | Payer: BC Managed Care – PPO | Source: Ambulatory Visit

## 2014-06-06 DIAGNOSIS — Z1231 Encounter for screening mammogram for malignant neoplasm of breast: Secondary | ICD-10-CM

## 2014-06-20 ENCOUNTER — Encounter: Payer: Self-pay | Admitting: Internal Medicine

## 2014-07-10 ENCOUNTER — Other Ambulatory Visit: Payer: Self-pay | Admitting: Family Medicine

## 2014-07-10 ENCOUNTER — Ambulatory Visit (INDEPENDENT_AMBULATORY_CARE_PROVIDER_SITE_OTHER): Payer: BC Managed Care – PPO | Admitting: Medical

## 2014-07-10 ENCOUNTER — Encounter: Payer: Self-pay | Admitting: Medical

## 2014-07-10 VITALS — BP 92/70 | HR 44 | Temp 97.6°F | Resp 16 | Ht 64.0 in | Wt 116.0 lb

## 2014-07-10 DIAGNOSIS — Z1211 Encounter for screening for malignant neoplasm of colon: Secondary | ICD-10-CM

## 2014-07-10 DIAGNOSIS — Z0000 Encounter for general adult medical examination without abnormal findings: Secondary | ICD-10-CM

## 2014-07-10 DIAGNOSIS — Z13220 Encounter for screening for lipoid disorders: Secondary | ICD-10-CM

## 2014-07-10 DIAGNOSIS — E559 Vitamin D deficiency, unspecified: Secondary | ICD-10-CM

## 2014-07-10 LAB — POCT URINALYSIS DIPSTICK
Bilirubin, UA: NEGATIVE
Blood, UA: NEGATIVE
GLUCOSE UA: NEGATIVE
Ketones, UA: NEGATIVE
Leukocytes, UA: NEGATIVE
Nitrite, UA: NEGATIVE
PH UA: 6.5
Protein, UA: NEGATIVE
SPEC GRAV UA: 1.02
Urobilinogen, UA: NEGATIVE

## 2014-07-10 NOTE — Progress Notes (Signed)
Subjective:   HPI  Kristina Newton is a 48 y.o. female who presents for a complete physical.  Medical care team includes:  [facility], [facility]  Gynecology, Dr. Arlyce DiceKaplan  Dentist, Dr. Zigmund DanielWeston  Eye doctor - established 2014  Kristina CoveyShane Tysinger, PA for primary care   Preventative care: Last ophthalmology visit: within last 12 months, has contacts now Last dental visit:YES DR. Vickki MuffWESTON Last colonoscopy:N/A Last mammogram:05/2013 Last gynecological exam:2013 AT [facility] Last EKG:09/2011 Last labs: last year  Prior vaccinations: TD or Tdap:08/2010 Influenza:05/2014 [facility] Pneumococcal:N/A Shingles/Zostavax:N/A  Advanced directive:N/A Health care power of attorney:N/.A Living will:N/A  Concerns: Hx/o elevated LDL particle numbers on prior NMRs.  Is actively doing triathlon training most days per week.  Very active.  Eating healthy ,small frequent meals.  Is down to her goal weight.   Patient's cardiac risk factors are: family history of premature cardiovascular disease. Patient's risk factors for DVT/PE: none. Previous cardiac testing: electrocardiogram (ECG).   Reviewed their medical, surgical, family, social, medication, and allergy history and updated chart as appropriate.  Past Medical History  Diagnosis Date  . Vitamin D insufficiency   . Fibrocystic breast disease   . Concern about skin cancer without diagnosis     regular [facility], [facility]  . Gynecologic exam normal     sees Dr. Ilda Moriichard Kaplan  . History of mammogram 05/27/2012  . Lipid screening     NMR lipoprofile - hx/o elevated LDL particle number and abnormal other particle, improved from 2013 - 2015.  low risk, no indicated therapy    Past Surgical History  Procedure Laterality Date  . Breast surgery  2007    breast biopsy  . Diagnostic mammogram  8/11    normal    History   Social History  . Marital Status: Married    Spouse Name: N/A    Number of  Children: N/A  . Years of Education: N/A   Occupational History  . Not on file.   Social History Main Topics  . Smoking status: Never Smoker   . Smokeless tobacco: Never Used  . Alcohol Use: 2.5 oz/week    5 Not specified per week  . Drug Use: No  . 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 Not on file   Social History Narrative   Aerobics, Runner, broadcasting/film/videostrength training, triathlons as of 2014 ongoing; Married, 2 children, ages 50yo and 15yo Isabelle Course(Lydia, dance for 12 years/[employer]).  Works in Airline pilotsales for [employer]    Family History  Problem Relation Age of Onset  . Cancer Mother     basal cell cancer  . Cancer Father     basal cell cancer  . Hypertension Father   . Hyperlipidemia Father   . Heart disease Father     arrythmia, stent/CAD?  . Depression Sister   . Stroke Neg Hx   . Diabetes Neg Hx     No current outpatient prescriptions on file.  Allergies  Allergen Reactions  . Penicillins     Review of Systems Constitutional: -fever, -chills, -sweats, -unexpected weight change, -decreased appetite, -fatigue Allergy: -sneezing, -itching, -congestion Dermatology: -changing moles, --rash, -lumps ENT: -runny nose, -ear pain, -sore throat, -hoarseness, -sinus pain, -teeth pain, - ringing in ears, -hearing loss, -nosebleeds Cardiology: -chest pain, -palpitations, -swelling, -difficulty breathing when lying flat, -waking up short of  breath Respiratory: -cough, -shortness of breath, -difficulty breathing with exercise or exertion, -wheezing, -coughing up blood Gastroenterology: -abdominal pain, -nausea, -vomiting, -diarrhea, -constipation, -blood in stool, -changes in bowel movement, -difficulty swallowing or eating Hematology: -bleeding, -bruising  Musculoskeletal: -joint aches, -muscle aches, -joint swelling, -back pain, -neck pain, -cramping, -changes in  gait Ophthalmology: denies vision changes, eye redness, itching, discharge Urology: -burning with urination, -difficulty urinating, -blood in urine, -urinary frequency, -urgency, -incontinence Neurology: -headache, -weakness, -tingling, -numbness, -memory loss, -falls, -dizziness Psychology: -depressed mood, -agitation, -sleep problems     Objective:   Physical Exam  BP 92/70 mmHg  Pulse 44  Temp(Src) 97.6 F (36.4 C) (Oral)  Resp 16  Ht 5\' 4"  (1.626 m)  Wt 116 lb (52.617 kg)  BMI 19.90 kg/m2  LMP 01/04/2011  General appearance: alert, no distress, WD/WN,white female, looks younger than stated age  Skin: scattered benign appearing macules of torso  HEENT: normocephalic, conjunctiva/corneas normal, sclerae anicteric, PERRLA, EOMi, nares patent, no discharge or erythema, pharynx normal  Oral cavity: MMM, tongue normal, teeth normal  Neck: supple, no lymphadenopathy, no thyromegaly, no masses, normal ROM, no bruits  Chest: non tender, normal shape and expansion  Heart: RRR, normal S1, S2, no murmurs  Lungs: CTA bilaterally, no wheezes, rhonchi, or rales  Abdomen: +bs, soft, non tender, non distended, no masses, no hepatomegaly, no splenomegaly, no bruits  Back: non tender, normal ROM, no scoliosis  Musculoskeletal: upper extremities non tender, no obvious deformity, normal ROM throughout, lower extremities non tender, no obvious deformity, normal ROM throughout  Extremities: no edema, no cyanosis, no clubbing  Pulses: 2+ symmetric, upper and lower extremities, normal cap refill  Neurological: alert, oriented x 3, CN2-12 intact, strength normal upper extremities and lower extremities, sensation normal throughout, DTRs 2+ throughout, no cerebellar signs, gait normal  Psychiatric: normal affect, behavior normal, pleasant  Breast and pelvic - deferred to gynecology    Assessment and Plan :    Encounter Diagnoses  Name Primary?  . Encounter for health maintenance examination in  adult Yes  . Vitamin D deficiency   . Lipid screening   . Screen for colon cancer     Physical exam - discussed healthy lifestyle, diet, exercise, preventative care, vaccinations, and addressed their concerns.  Handout given. Lipid screen - reviewed her [facility] NMR Lipo profile from 2014.   Given her low cardiac risk, ASCVD calculated 10 year risk of under 1%, healthy lifestyle, and improvements from 2013 til now with diet/exercise, no major indication for statin or other, plus she declines medication anyhow.  Plan repeat NMR lipo profile in 1-2 years.  We will refer for first screening colonoscopy Vit D deficiency - advised she use OTC Vit D, lab today Follow-up pending labs.

## 2014-07-11 LAB — BASIC METABOLIC PANEL
BUN: 14 mg/dL (ref 6–23)
CHLORIDE: 102 meq/L (ref 96–112)
CO2: 30 meq/L (ref 19–32)
CREATININE: 0.91 mg/dL (ref 0.50–1.10)
Calcium: 9.2 mg/dL (ref 8.4–10.5)
Glucose, Bld: 97 mg/dL (ref 70–99)
Potassium: 4.1 mEq/L (ref 3.5–5.3)
Sodium: 138 mEq/L (ref 135–145)

## 2014-07-11 LAB — CBC
HCT: 40.6 % (ref 36.0–46.0)
HEMOGLOBIN: 13.1 g/dL (ref 12.0–15.0)
MCH: 30.5 pg (ref 26.0–34.0)
MCHC: 32.3 g/dL (ref 30.0–36.0)
MCV: 94.6 fL (ref 78.0–100.0)
MPV: 10.3 fL (ref 9.4–12.4)
Platelets: 243 10*3/uL (ref 150–400)
RBC: 4.29 MIL/uL (ref 3.87–5.11)
RDW: 14 % (ref 11.5–15.5)
WBC: 3.7 10*3/uL — ABNORMAL LOW (ref 4.0–10.5)

## 2014-07-11 LAB — VITAMIN D 25 HYDROXY (VIT D DEFICIENCY, FRACTURES): Vit D, 25-Hydroxy: 25 ng/mL — ABNORMAL LOW (ref 30–100)

## 2014-07-12 ENCOUNTER — Other Ambulatory Visit: Payer: Self-pay | Admitting: Medical

## 2014-07-12 MED ORDER — VITAMIN D (ERGOCALCIFEROL) 1.25 MG (50000 UNIT) PO CAPS: 50000.0000 [IU] | ORAL_CAPSULE | ORAL | Status: DC

## 2014-10-23 ENCOUNTER — Other Ambulatory Visit (INDEPENDENT_AMBULATORY_CARE_PROVIDER_SITE_OTHER): Payer: BLUE CROSS/BLUE SHIELD

## 2014-10-23 DIAGNOSIS — Z23 Encounter for immunization: Secondary | ICD-10-CM | POA: Diagnosis not present

## 2015-01-11 ENCOUNTER — Ambulatory Visit: Payer: BLUE CROSS/BLUE SHIELD | Admitting: Medical

## 2015-01-12 ENCOUNTER — Ambulatory Visit (INDEPENDENT_AMBULATORY_CARE_PROVIDER_SITE_OTHER): Payer: BLUE CROSS/BLUE SHIELD | Admitting: Family Medicine

## 2015-01-12 ENCOUNTER — Encounter: Payer: Self-pay | Admitting: Family Medicine

## 2015-01-12 VITALS — BP 110/70 | HR 60 | Temp 98.6°F | Ht 63.75 in | Wt 113.4 lb

## 2015-01-12 DIAGNOSIS — L237 Allergic contact dermatitis due to plants, except food: Secondary | ICD-10-CM

## 2015-01-12 DIAGNOSIS — M2540 Effusion, unspecified joint: Secondary | ICD-10-CM | POA: Diagnosis not present

## 2015-01-12 DIAGNOSIS — L03115 Cellulitis of right lower limb: Secondary | ICD-10-CM | POA: Diagnosis not present

## 2015-01-12 MED ORDER — METHYLPREDNISOLONE ACETATE 80 MG/ML IJ SUSP
80.0000 mg | Freq: Once | INTRAMUSCULAR | Status: AC
  Administered 2015-01-12: 80 mg via INTRAMUSCULAR

## 2015-01-12 NOTE — Patient Instructions (Signed)
  Cellulitis appears to be resolving. The area of swelling in the left upper thigh, given that is it itchy, and the skin otherwise appears normal (it isn't hard, no weeping of skin), I feel like it is more of an inflammation or reaction, rather than infection.  Unlike the area on the right calf--which was swollen, hard, redder--and that area is improving with the antibiotics.  Complete the course of Bactrim. If you have new rashes or new areas of joint swelling, please stop it and call to change the remainder of the course to a different antibiotic  (doxy or keflex--allergic to PCN).  Start taking antihistamine daily (either claritin or zyrtec).  We are giving you a steroid injection today that should help with inflammation and any itching.    Touch base with me over the next few days if things are changing, getting worse.  You can use benadryl at night, if needed to help with sleep.

## 2015-01-12 NOTE — Progress Notes (Signed)
Chief Complaint  Patient presents with  . Edema    started yesterday afternoon.    She first noticed rash consistent with poison ivy on 5/18, a few days after working in her yard.  She first noticed "itchy rash" on her lower legs and wrist.  [place] then [place], [place] 5[place].  Then she started having larger blisters (5/23-24), which have been oozing.  She went to [facility] on 5/25 where she was diagnosed with cellulitis in the right calf--there had been significant redness and swelling around an area of large blisters.  She was put on Bactrim, and since then the redness and swelling has decreased some.  She still has a lot of weeping from the blisters on the lower legs.  She had a linear patch of blisters on her right upper thigh (I saw these 2 nights ago), which has subsequently dried up. But now presents with increasing redness and swelling of the left medial thigh.  This area is very itchy  She didn't take Zyrtec yesterday (thought it kept her awake--turns out, she thinks it was the bactrim causing insomnia).    She has also noticed swelling in her left ankle yesterday afternoon.  Denies any trauma/injury.  It wasn't red, or warm, and there is no associated rash over that area (small bleb posteriorly, but without any redness or focal swelling). No ankle pain, just stiffness due to swelling.  She used a new soap/lotion that she feels caused a rash on her anterior arms. She has stopped using it, but has persistent mild rash which is slightly itchy. No rash elsewhere (none on torso, upper arms)  Rash on her anterior arms related to a new soap/lotion she used.  Still persistent and slightly itchy  PMH, PSH, SH reviewed.  Outpatient Encounter Prescriptions as of 01/12/2015  Medication Sig  . ibuprofen (ADVIL,MOTRIN) 200 MG tablet Take 400 mg by mouth every 6 (six) hours as needed.  . sulfamethoxazole-trimethoprim (BACTRIM DS,SEPTRA DS) 800-160 MG per tablet Take  1 tablet by mouth 2 (two) times daily.  . Vitamin D, Ergocalciferol, (DRISDOL) 50000 UNITS CAPS capsule Take 1 capsule (50,000 Units total) by mouth every 7 (seven) days. (Patient not taking: Reported on 01/12/2015)  . [EXPIRED] methylPREDNISolone acetate (DEPO-MEDROL) injection 80 mg    No facility-administered encounter medications on file as of 01/12/2015.  has also been using zyrtec for a couple of days, hasn't taken today.  ROS: no fever, chills, headache, nausea, vomiting, diarrhea, bleeding, abdominal pain, chest pain, shortness of breath.  No joint pains (just swelling in the ankle as per HPI).   PHYSICAL EXAM: BP 110/70 mmHg  Pulse 60  Temp(Src) 98.6 F (37 C) (Tympanic)  Ht 5' 3.75" (1.619 m)  Wt 113 lb 6.4 oz (51.438 kg)  BMI 19.62 kg/m2  LMP 01/04/2011  Well developed female, in no acute distress Skin: left medial thigh: 12 x 15cm area of mild soft tissue swelling and mild erythema (light pink, not angry red).  Within this area there is healing rash--slight flaking. No crusting.  Not indurated, it is soft. No streaks, nontender  She does have a papular rash on her right medial thigh, in a kissing distribution to the area that is swollen and red. These are not vesicular, and not weeping.  She has shaved since having the cellulitis, with the same razor, and there is mild diffuse folliculitis.   Right medial calf-- 8.5 x 4.5 cm area of erythema, with weeping.  There is minmal surrounding erythema.  This area was observed by myself 2 nights ago, and had further redness, induration and swelling, which has improved.  The wounds/blebs/ulcers have also improved since then.    She has scattered vesicles and blebs elsehwere on her lower legs--left posterior calf, and also medially closer to the knee.  She has lesions on the right medial ankle, and at both wrists, which are starting to heal.   Right ankle--has some mild diffuse swelling, most noticeable medially.  There is no erythema,  warmth, or overlying rash.  Slight decreased ROM  ASSESSMENT/PLAN:  Cellulitis of right lower extremity - Plan: methylPREDNISolone acetate (DEPO-MEDROL) injection 80 mg  Poison ivy dermatitis - Plan: methylPREDNISolone acetate (DEPO-MEDROL) injection 80 mg  Joint swelling  Cellulitis appears to be resolving with Bactrim. The area of swelling in the left upper thigh-- given that is it itchy, and the skin otherwise appears normal (it isn't hard, no weeping of skin), I feel like it is more of an inflammation or reaction, rather than infection.  Unlike the area on the right calf--which was swollen, hard, redder--and that area is improving with the antibiotics. Must consider the ankle swelling and this area to be possibly related--consider the possibility of reaction to Bactrim.  We discussed this, and she elects to proceed with continuing the Bactrim, but if new concerns develop (other areas of swelling, rash on her torso, etc) then she will stop and call to change antibiotic.  Risks/side effects of steroids were reviewed in detail.  Complete the course of Bactrim. If you have new rashes or new areas of joint swelling, please stop it and call to change the remainder of the course to a different antibiotic  (doxy or keflex--allergic to PCN).  Start taking antihistamine daily (either claritin or zyrtec).  We are giving you a steroid injection today that should help with inflammation and any itching.    Touch base with me over the next few days if things are changing, getting worse.

## 2015-01-13 ENCOUNTER — Encounter: Payer: Self-pay | Admitting: Family Medicine

## 2015-03-28 ENCOUNTER — Other Ambulatory Visit (INDEPENDENT_AMBULATORY_CARE_PROVIDER_SITE_OTHER): Payer: BLUE CROSS/BLUE SHIELD

## 2015-03-28 DIAGNOSIS — Z23 Encounter for immunization: Secondary | ICD-10-CM | POA: Diagnosis not present

## 2015-04-03 ENCOUNTER — Encounter: Payer: Self-pay | Admitting: Internal Medicine

## 2015-04-16 ENCOUNTER — Encounter: Payer: Self-pay | Admitting: Internal Medicine

## 2015-04-30 ENCOUNTER — Other Ambulatory Visit (INDEPENDENT_AMBULATORY_CARE_PROVIDER_SITE_OTHER): Payer: BLUE CROSS/BLUE SHIELD

## 2015-04-30 DIAGNOSIS — Z23 Encounter for immunization: Secondary | ICD-10-CM

## 2015-05-21 ENCOUNTER — Other Ambulatory Visit: Payer: Self-pay

## 2015-05-21 DIAGNOSIS — Z1231 Encounter for screening mammogram for malignant neoplasm of breast: Secondary | ICD-10-CM

## 2015-06-05 ENCOUNTER — Encounter: Payer: Self-pay | Admitting: Internal Medicine

## 2015-06-15 ENCOUNTER — Ambulatory Visit: Payer: Self-pay

## 2015-06-15 ENCOUNTER — Ambulatory Visit (AMBULATORY_SURGERY_CENTER): Payer: Self-pay

## 2015-06-15 VITALS — Ht 63.75 in | Wt 108.8 lb

## 2015-06-15 DIAGNOSIS — Z1211 Encounter for screening for malignant neoplasm of colon: Secondary | ICD-10-CM

## 2015-06-15 MED ORDER — SUPREP BOWEL PREP KIT 17.5-3.13-1.6 GM/177ML PO SOLN: 1.0000 | Freq: Once | ORAL | Status: DC

## 2015-06-15 NOTE — Progress Notes (Signed)
No allergies to eggs or soy No diet/weight loss meds No home oxygen No past problems with anesthesia  Has email  Emmi instructions given for colonoscopy 

## 2015-06-19 HISTORY — PX: COLONOSCOPY: SHX174

## 2015-06-21 ENCOUNTER — Ambulatory Visit
Admission: RE | Admit: 2015-06-21 | Discharge: 2015-06-21 | Disposition: A | Discharge status: Home / Self Care | Payer: BLUE CROSS/BLUE SHIELD | Source: Ambulatory Visit

## 2015-06-21 DIAGNOSIS — Z1231 Encounter for screening mammogram for malignant neoplasm of breast: Secondary | ICD-10-CM

## 2015-06-25 ENCOUNTER — Encounter: Payer: Self-pay | Admitting: Internal Medicine

## 2015-06-26 ENCOUNTER — Encounter: Payer: Self-pay | Admitting: Internal Medicine

## 2015-06-29 ENCOUNTER — Encounter: Payer: Self-pay | Admitting: Internal Medicine

## 2015-06-29 ENCOUNTER — Ambulatory Visit (AMBULATORY_SURGERY_CENTER): Payer: BLUE CROSS/BLUE SHIELD | Admitting: Internal Medicine

## 2015-06-29 VITALS — BP 115/73 | HR 42 | Temp 97.4°F | Resp 22 | Ht 63.75 in | Wt 108.0 lb

## 2015-06-29 DIAGNOSIS — Z1211 Encounter for screening for malignant neoplasm of colon: Secondary | ICD-10-CM | POA: Diagnosis not present

## 2015-06-29 MED ORDER — SODIUM CHLORIDE 0.9 % IV SOLN: 500.0000 mL | INTRAVENOUS | Status: DC

## 2015-06-29 NOTE — Op Note (Signed)
[facility] [address] [place], [id number]   COLONOSCOPY PROCEDURE REPORT  PATIENT: Kristina Newton, Kristina HearingSusan Newton  MR#: [id number] BIRTHDATE: 05-21-1966 , 49  yrs. old GENDER: female ENDOSCOPIST: Beverley FiedlerJay M Calden Dorsey, MD REFERRED AO:ZHYQBY:John Susann GivensLaLonde, M.D. PROCEDURE DATE:  06/29/2015 PROCEDURE:   Colonoscopy, screening First Screening Colonoscopy - Avg.  risk and is 50 yrs.  old or older Yes.  Prior Negative Screening - Now for repeat screening. N/A  History of Adenoma - Now for follow-up colonoscopy & has been > or = to 3 yrs.  N/A  Polyps removed today? No Polyps removed today? No Recommend repeat exam, <10 yrs? No ASA CLASS:   Class I INDICATIONS:Screening for colonic neoplasia, Colorectal Neoplasm Risk Assessment for this procedure is average risk, and 1st colonoscopy. MEDICATIONS: Monitored anesthesia care and Propofol 200 mg IV  DESCRIPTION OF PROCEDURE:   After the risks benefits and alternatives of the procedure were thoroughly explained, informed consent was obtained.  The digital rectal exam revealed no abnormalities of the rectum.   The LB [id number]-[id number]  endoscope was introduced through the anus and advanced to the cecum, which was identified by both the appendix and ileocecal valve. No adverse events experienced.   The quality of the prep was excellent. (Suprep was used)  The instrument was then slowly withdrawn as the colon was fully examined. Estimated blood loss is zero unless otherwise noted in this procedure report.      COLON FINDINGS: A normal appearing cecum, ileocecal valve, and appendiceal orifice were identified.  the ascending, transverse, descending, sigmoid colon, and rectum appeared unremarkable. Retroflexed views revealed no abnormalities. The time to cecum = 8.4 Withdrawal time = 7.8   The scope was withdrawn and the procedure completed.  COMPLICATIONS: There were no complications.  ENDOSCOPIC IMPRESSION: Normal  colonoscopy  RECOMMENDATIONS: Continue current colorectal screening recommendations for "routine risk" patients with a repeat colonoscopy in 10 years.  eSigned:  Beverley FiedlerJay M Sami Froh, MD 06/29/2015 9:51 AM   cc: Sharlot GowdaJohn LaLonde, MD and The Patient

## 2015-06-29 NOTE — Patient Instructions (Signed)
YOU HAD AN ENDOSCOPIC PROCEDURE TODAY AT THE [facility]:   Refer to the procedure report that was given to you for any specific questions about what was found during the examination.  If the procedure report does not answer your questions, please call your gastroenterologist to clarify.  If you requested that your care partner not be given the details of your procedure findings, then the procedure report has been included in a sealed envelope for you to review at your convenience later.  YOU SHOULD EXPECT: Some feelings of bloating in the abdomen. Passage of more gas than usual.  Walking can help get rid of the air that was put into your GI tract during the procedure and reduce the bloating. If you had a lower endoscopy (such as a colonoscopy or flexible sigmoidoscopy) you may notice spotting of blood in your stool or on the toilet paper. If you underwent a bowel prep for your procedure, you may not have a normal bowel movement for a few days.  Please Note:  You might notice some irritation and congestion in your nose or some drainage.  This is from the oxygen used during your procedure.  There is no need for concern and it should clear up in a day or so.  SYMPTOMS TO REPORT IMMEDIATELY:   Following lower endoscopy (colonoscopy or flexible sigmoidoscopy):  Excessive amounts of blood in the stool  Significant tenderness or worsening of abdominal pains  Swelling of the abdomen that is new, acute  Fever of 100F or higher   For urgent or emergent issues, a gastroenterologist can be reached at any hour by calling [phone].   DIET: Your first meal following the procedure should be a small meal and then it is ok to progress to your normal diet. Heavy or fried foods are harder to digest and may make you feel nauseous or bloated.  Likewise, meals heavy in dairy and vegetables can increase bloating.  Drink plenty of fluids but you should avoid alcoholic beverages for 24  hours.  ACTIVITY:  You should plan to take it easy for the rest of today and you should NOT DRIVE or use heavy machinery until tomorrow (because of the sedation medicines used during the test).    FOLLOW UP: Our staff will call the number listed on your records the next business day following your procedure to check on you and address any questions or concerns that you may have regarding the information given to you following your procedure. If we do not reach you, we will leave a message.  However, if you are feeling well and you are not experiencing any problems, there is no need to return our call.  We will assume that you have returned to your regular daily activities without incident.  If any biopsies were taken you will be contacted by phone or by letter within the next 1-3 weeks.  Please call us at [phone] if you have not heard about the biopsies in 3 weeks.    SIGNATURES/CONFIDENTIALITY: You and/or your care partner have signed paperwork which will be entered into your electronic medical record.  These signatures attest to the fact that that the information above on your After Visit Summary has been reviewed and is understood.  Full responsibility of the confidentiality of this discharge information lies with you and/or your care-partner. 

## 2015-06-29 NOTE — Progress Notes (Signed)
A/ox3, pleased with MAC, report to RN 

## 2015-07-02 ENCOUNTER — Telehealth: Payer: Self-pay | Admitting: *Deleted

## 2015-07-02 NOTE — Telephone Encounter (Signed)
  Follow up Call-  Call back number 06/29/2015  Post procedure Call Back phone  # [phone]  Permission to leave phone message Yes     Patient questions:  Do you have a fever, pain , or abdominal swelling? No. Pain Score  0 *  Have you tolerated food without any problems? Yes.    Have you been able to return to your normal activities? Yes.    Do you have any questions about your discharge instructions: Diet   No. Medications  No. Follow up visit  No.  Do you have questions or concerns about your Care? No.  Actions: * If pain score is 4 or above: No action needed, pain <4.

## 2016-02-05 ENCOUNTER — Ambulatory Visit (INDEPENDENT_AMBULATORY_CARE_PROVIDER_SITE_OTHER): Payer: BLUE CROSS/BLUE SHIELD | Admitting: Medical

## 2016-02-05 ENCOUNTER — Encounter: Payer: Self-pay | Admitting: Medical

## 2016-02-05 VITALS — BP 116/70 | HR 54 | Ht 63.75 in | Wt 111.0 lb

## 2016-02-05 DIAGNOSIS — S8992XA Unspecified injury of left lower leg, initial encounter: Secondary | ICD-10-CM | POA: Diagnosis not present

## 2016-02-05 DIAGNOSIS — R143 Flatulence: Secondary | ICD-10-CM

## 2016-02-05 DIAGNOSIS — Z0000 Encounter for general adult medical examination without abnormal findings: Secondary | ICD-10-CM

## 2016-02-05 DIAGNOSIS — M79605 Pain in left leg: Secondary | ICD-10-CM

## 2016-02-05 DIAGNOSIS — Z111 Encounter for screening for respiratory tuberculosis: Secondary | ICD-10-CM | POA: Diagnosis not present

## 2016-02-05 DIAGNOSIS — T148 Other injury of unspecified body region: Secondary | ICD-10-CM | POA: Diagnosis not present

## 2016-02-05 DIAGNOSIS — M21952 Unspecified acquired deformity of left thigh: Secondary | ICD-10-CM

## 2016-02-05 DIAGNOSIS — E559 Vitamin D deficiency, unspecified: Secondary | ICD-10-CM | POA: Diagnosis not present

## 2016-02-05 DIAGNOSIS — IMO0002 Reserved for concepts with insufficient information to code with codable children: Secondary | ICD-10-CM

## 2016-02-05 DIAGNOSIS — Z23 Encounter for immunization: Secondary | ICD-10-CM | POA: Diagnosis not present

## 2016-02-05 DIAGNOSIS — M79602 Pain in left arm: Secondary | ICD-10-CM

## 2016-02-05 DIAGNOSIS — G8929 Other chronic pain: Secondary | ICD-10-CM

## 2016-02-05 DIAGNOSIS — M21962 Unspecified acquired deformity of left lower leg: Secondary | ICD-10-CM

## 2016-02-05 DIAGNOSIS — M7072 Other bursitis of hip, left hip: Secondary | ICD-10-CM

## 2016-02-05 LAB — COMPREHENSIVE METABOLIC PANEL
ALBUMIN: 4.2 g/dL (ref 3.6–5.1)
ALT: 14 U/L (ref 6–29)
AST: 20 U/L (ref 10–35)
Alkaline Phosphatase: 35 U/L (ref 33–130)
BUN: 19 mg/dL (ref 7–25)
CHLORIDE: 103 mmol/L (ref 98–110)
CO2: 27 mmol/L (ref 20–31)
CREATININE: 1.22 mg/dL — AB (ref 0.50–1.05)
Calcium: 9.3 mg/dL (ref 8.6–10.4)
Glucose, Bld: 90 mg/dL (ref 65–99)
Potassium: 4.5 mmol/L (ref 3.5–5.3)
SODIUM: 139 mmol/L (ref 135–146)
Total Bilirubin: 0.5 mg/dL (ref 0.2–1.2)
Total Protein: 6.3 g/dL (ref 6.1–8.1)

## 2016-02-05 LAB — CBC
HEMATOCRIT: 40.1 % (ref 35.0–45.0)
Hemoglobin: 13 g/dL (ref 11.7–15.5)
MCH: 30.3 pg (ref 27.0–33.0)
MCHC: 32.4 g/dL (ref 32.0–36.0)
MCV: 93.5 fL (ref 80.0–100.0)
MPV: 10.1 fL (ref 7.5–12.5)
Platelets: 225 10*3/uL (ref 140–400)
RBC: 4.29 MIL/uL (ref 3.80–5.10)
RDW: 13.8 % (ref 11.0–15.0)
WBC: 4 10*3/uL (ref 4.0–10.5)

## 2016-02-05 LAB — POCT URINALYSIS DIPSTICK
Bilirubin, UA: NEGATIVE
Blood, UA: NEGATIVE
GLUCOSE UA: NEGATIVE
KETONES UA: NEGATIVE
LEUKOCYTES UA: NEGATIVE
Nitrite, UA: NEGATIVE
PROTEIN UA: NEGATIVE
SPEC GRAV UA: 1.02
Urobilinogen, UA: NEGATIVE
pH, UA: 6

## 2016-02-05 LAB — TSH: TSH: 1.81 mIU/L
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
## 2016-02-06 ENCOUNTER — Encounter: Payer: Self-pay | Admitting: Medical

## 2016-02-06 DIAGNOSIS — Z1159 Encounter for screening for other viral diseases: Secondary | ICD-10-CM | POA: Insufficient documentation

## 2016-02-06 DIAGNOSIS — IMO0002 Reserved for concepts with insufficient information to code with codable children: Secondary | ICD-10-CM | POA: Insufficient documentation

## 2016-02-06 DIAGNOSIS — Z111 Encounter for screening for respiratory tuberculosis: Secondary | ICD-10-CM | POA: Insufficient documentation

## 2016-02-06 DIAGNOSIS — Z1211 Encounter for screening for malignant neoplasm of colon: Secondary | ICD-10-CM | POA: Insufficient documentation

## 2016-02-06 DIAGNOSIS — Z23 Encounter for immunization: Secondary | ICD-10-CM | POA: Insufficient documentation

## 2016-02-06 DIAGNOSIS — E559 Vitamin D deficiency, unspecified: Secondary | ICD-10-CM | POA: Insufficient documentation

## 2016-02-06 DIAGNOSIS — R143 Flatulence: Secondary | ICD-10-CM | POA: Insufficient documentation

## 2016-02-06 DIAGNOSIS — S8992XA Unspecified injury of left lower leg, initial encounter: Secondary | ICD-10-CM | POA: Insufficient documentation

## 2016-02-06 DIAGNOSIS — Z0000 Encounter for general adult medical examination without abnormal findings: Principal | ICD-10-CM

## 2016-02-06 LAB — VITAMIN D 25 HYDROXY (VIT D DEFICIENCY, FRACTURES): Vit D, 25-Hydroxy: 33 ng/mL (ref 30–100)

## 2016-02-07 LAB — TB SKIN TEST
Induration: 0 mm
TB Skin Test: NEGATIVE

## 2016-02-11 ENCOUNTER — Telehealth: Payer: Self-pay | Admitting: Medical

## 2016-02-11 DIAGNOSIS — M7070 Other bursitis of hip, unspecified hip: Secondary | ICD-10-CM | POA: Insufficient documentation

## 2016-02-11 DIAGNOSIS — G8929 Other chronic pain: Secondary | ICD-10-CM | POA: Insufficient documentation

## 2016-02-11 DIAGNOSIS — M21959 Unspecified acquired deformity of unspecified thigh: Secondary | ICD-10-CM | POA: Insufficient documentation

## 2016-02-11 DIAGNOSIS — M79606 Pain in leg, unspecified: Secondary | ICD-10-CM

## 2016-02-11 NOTE — Telephone Encounter (Signed)
Called pt to inform that Alternative Vehicle Request Form has been completed. She advised that she did not want us to fax the form. She will pick the form up and fax it herself.

## 2016-02-12 NOTE — Addendum Note (Signed)
Addended by: Kieth BrightlyLAWSON, Leisha Trinkle M on: 02/12/2016 08:49 AM   Modules accepted: Kipp BroodSmartSet

## 2016-02-14 ENCOUNTER — Telehealth: Payer: Self-pay

## 2016-02-14 ENCOUNTER — Other Ambulatory Visit: Payer: Self-pay | Admitting: Medical

## 2016-02-14 ENCOUNTER — Ambulatory Visit
Admission: RE | Admit: 2016-02-14 | Discharge: 2016-02-14 | Disposition: A | Discharge status: Home / Self Care | Payer: BLUE CROSS/BLUE SHIELD | Source: Ambulatory Visit | Attending: Medical | Admitting: Medical

## 2016-02-14 ENCOUNTER — Telehealth: Payer: Self-pay | Admitting: Medical

## 2016-02-14 DIAGNOSIS — R7989 Other specified abnormal findings of blood chemistry: Secondary | ICD-10-CM

## 2016-02-14 DIAGNOSIS — R799 Abnormal finding of blood chemistry, unspecified: Secondary | ICD-10-CM

## 2016-02-14 DIAGNOSIS — M79605 Pain in left leg: Secondary | ICD-10-CM

## 2016-02-14 NOTE — Telephone Encounter (Signed)
Pt called to discuss labs, I never received her results. Went over what I seen in her chart. Pt said she has a lot of questions and would feel best if you called her to discuss all of this. She wants to check her insurance coverage with the xrays and is wanting to wait on seeing an orthopedist right now.

## 2016-02-14 NOTE — Telephone Encounter (Signed)
[facility] stated that if it is true trochanter bursitits they start with an injection. Normally that is not the only thing going on and they will take different methods to see what all the problems are. They do use the iontophoresis.

## 2016-02-14 NOTE — Telephone Encounter (Signed)
please call [facility] and ask to speak to the therapist personally to see what treatments they can do for trochanteric bursitis?  Also, do they do iontophoresis for this as well?

## 2016-06-02 ENCOUNTER — Other Ambulatory Visit: Payer: Self-pay | Admitting: Family Medicine

## 2016-06-02 DIAGNOSIS — Z1231 Encounter for screening mammogram for malignant neoplasm of breast: Secondary | ICD-10-CM

## 2016-06-18 HISTORY — PX: AUGMENTATION MAMMAPLASTY: SUR837

## 2016-06-23 ENCOUNTER — Ambulatory Visit
Admission: RE | Admit: 2016-06-23 | Discharge: 2016-06-23 | Disposition: A | Discharge status: Home / Self Care | Payer: BLUE CROSS/BLUE SHIELD | Source: Ambulatory Visit | Attending: Family Medicine | Admitting: Family Medicine

## 2016-06-23 DIAGNOSIS — Z1231 Encounter for screening mammogram for malignant neoplasm of breast: Secondary | ICD-10-CM

## 2016-08-18 DIAGNOSIS — R7989 Other specified abnormal findings of blood chemistry: Secondary | ICD-10-CM

## 2016-08-18 HISTORY — DX: Other specified abnormal findings of blood chemistry: R79.89

## 2016-08-18 HISTORY — PX: ANTERIOR CRUCIATE LIGAMENT REPAIR: SHX115

## 2017-01-26 ENCOUNTER — Other Ambulatory Visit: Payer: Self-pay | Admitting: Obstetrics & Gynecology

## 2017-06-11 ENCOUNTER — Other Ambulatory Visit: Payer: Self-pay | Admitting: Family Medicine

## 2017-06-11 DIAGNOSIS — Z1231 Encounter for screening mammogram for malignant neoplasm of breast: Secondary | ICD-10-CM

## 2017-07-02 ENCOUNTER — Ambulatory Visit: Payer: BLUE CROSS/BLUE SHIELD

## 2017-07-02 ENCOUNTER — Ambulatory Visit
Admission: RE | Admit: 2017-07-02 | Discharge: 2017-07-02 | Disposition: A | Discharge status: Home / Self Care | Payer: BLUE CROSS/BLUE SHIELD | Source: Ambulatory Visit | Attending: Family Medicine | Admitting: Family Medicine

## 2017-07-02 DIAGNOSIS — Z1231 Encounter for screening mammogram for malignant neoplasm of breast: Secondary | ICD-10-CM

## 2017-09-06 LAB — HM COLONOSCOPY

## 2017-11-30 ENCOUNTER — Ambulatory Visit (INDEPENDENT_AMBULATORY_CARE_PROVIDER_SITE_OTHER): Payer: BLUE CROSS/BLUE SHIELD | Admitting: Family Medicine

## 2017-11-30 ENCOUNTER — Encounter: Payer: Self-pay | Admitting: Family Medicine

## 2017-11-30 DIAGNOSIS — S161XXA Strain of muscle, fascia and tendon at neck level, initial encounter: Secondary | ICD-10-CM | POA: Diagnosis not present

## 2017-11-30 DIAGNOSIS — S90111A Contusion of right great toe without damage to nail, initial encounter: Secondary | ICD-10-CM | POA: Diagnosis not present
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
## 2018-03-24 DIAGNOSIS — Z113 Encounter for screening for infections with a predominantly sexual mode of transmission: Secondary | ICD-10-CM | POA: Diagnosis not present

## 2018-03-24 DIAGNOSIS — Z01419 Encounter for gynecological examination (general) (routine) without abnormal findings: Secondary | ICD-10-CM | POA: Diagnosis not present

## 2018-03-24 DIAGNOSIS — Z124 Encounter for screening for malignant neoplasm of cervix: Secondary | ICD-10-CM | POA: Diagnosis not present

## 2018-03-24 DIAGNOSIS — Z681 Body mass index (BMI) 19 or less, adult: Secondary | ICD-10-CM | POA: Diagnosis not present

## 2018-03-24 LAB — HM PAP SMEAR: HM Pap smear: POSITIVE

## 2018-04-14 DIAGNOSIS — R87619 Unspecified abnormal cytological findings in specimens from cervix uteri: Secondary | ICD-10-CM | POA: Diagnosis not present

## 2018-04-14 DIAGNOSIS — Z681 Body mass index (BMI) 19 or less, adult: Secondary | ICD-10-CM | POA: Diagnosis not present

## 2018-04-14 DIAGNOSIS — N870 Mild cervical dysplasia: Secondary | ICD-10-CM | POA: Diagnosis not present

## 2018-05-27 DIAGNOSIS — Z23 Encounter for immunization: Secondary | ICD-10-CM | POA: Diagnosis not present

## 2018-06-07 ENCOUNTER — Other Ambulatory Visit: Payer: Self-pay | Admitting: Family Medicine

## 2018-06-07 DIAGNOSIS — Z1231 Encounter for screening mammogram for malignant neoplasm of breast: Secondary | ICD-10-CM

## 2018-07-19 ENCOUNTER — Ambulatory Visit
Admission: RE | Admit: 2018-07-19 | Discharge: 2018-07-19 | Disposition: A | Discharge status: Home / Self Care | Payer: BLUE CROSS/BLUE SHIELD | Source: Ambulatory Visit | Attending: Family Medicine | Admitting: Family Medicine

## 2018-07-19 DIAGNOSIS — Z1231 Encounter for screening mammogram for malignant neoplasm of breast: Secondary | ICD-10-CM | POA: Diagnosis not present

## 2018-07-29 ENCOUNTER — Ambulatory Visit (INDEPENDENT_AMBULATORY_CARE_PROVIDER_SITE_OTHER): Payer: BLUE CROSS/BLUE SHIELD | Admitting: Family Medicine

## 2018-07-29 ENCOUNTER — Encounter: Payer: Self-pay | Admitting: Family Medicine

## 2018-07-29 VITALS — BP 100/60 | HR 56 | Temp 98.6°F | Ht 63.75 in | Wt 115.0 lb

## 2018-07-29 DIAGNOSIS — J069 Acute upper respiratory infection, unspecified: Secondary | ICD-10-CM | POA: Diagnosis not present

## 2018-07-29 DIAGNOSIS — N3090 Cystitis, unspecified without hematuria: Secondary | ICD-10-CM | POA: Diagnosis not present

## 2018-07-29 DIAGNOSIS — K219 Gastro-esophageal reflux disease without esophagitis: Secondary | ICD-10-CM

## 2018-07-29 DIAGNOSIS — J029 Acute pharyngitis, unspecified: Secondary | ICD-10-CM | POA: Diagnosis not present

## 2018-07-29 LAB — POCT RAPID STREP A (OFFICE): Rapid Strep A Screen: NEGATIVE

## 2018-07-29 NOTE — Patient Instructions (Addendum)
Use sudafed (decongestant) to help with the nasal congestion and postnasal drainage which is contributing to your sore throat. Continue salt water gargles, and use tylenol and/or ibuprofen as needed for pain. Reflux can contribute to sore throat, but you have drainage visible that is also a factor.  Please try and remember to get up and void shortly after intercourse to prevent urinary tract infection.  Cranberry juice or extract at first onset of symptoms can also sometimes help prevent infections.  Take Prilosec OTC once daily for the next 2 weeks.  Take it prior to dinner. After 2 weeks, if symptoms are improved, you can try taking it every other day, or just prior to meals which you know will trigger heartburn. Or you can switch back to Pepcid 20mg  before dinner (regularly or just as needed).    Food Choices for Gastroesophageal Reflux Disease, Adult When you have gastroesophageal reflux disease (GERD), the foods you eat and your eating habits are very important. Choosing the right foods can help ease the discomfort of GERD. Consider working with a diet and nutrition specialist (dietitian) to help you make healthy food choices. What general guidelines should I follow? Eating plan  Choose healthy foods low in fat, such as fruits, vegetables, whole grains, low-fat dairy products, and lean meat, fish, and poultry.  Eat frequent, small meals instead of three large meals each day. Eat your meals slowly, in a relaxed setting. Avoid bending over or lying down until 2-3 hours after eating.  Limit high-fat foods such as fatty meats or fried foods.  Limit your intake of oils, butter, and shortening to less than 8 teaspoons each day.  Avoid the following: ? Foods that cause symptoms. These may be different for different people. Keep a food diary to keep track of foods that cause symptoms. ? Alcohol. ? Drinking large amounts of liquid with meals. ? Eating meals during the 2-3 hours before  bed.  Cook foods using methods other than frying. This may include baking, grilling, or broiling. Lifestyle   Maintain a healthy weight. Ask your health care provider what weight is healthy for you. If you need to lose weight, work with your health care provider to do so safely.  Exercise for at least 30 minutes on 5 or more days each week, or as told by your health care provider.  Avoid wearing clothes that fit tightly around your waist and chest.  Do not use any products that contain nicotine or tobacco, such as cigarettes and e-cigarettes. If you need help quitting, ask your health care provider.  Sleep with the head of your bed raised. Use a wedge under the mattress or blocks under the bed frame to raise the head of the bed. What foods are not recommended? The items listed may not be a complete list. Talk with your dietitian about what dietary choices are best for you. Grains Pastries or quick breads with added fat. [place] toast. Vegetables Deep fried vegetables. [place] fries. Any vegetables prepared with added fat. Any vegetables that cause symptoms. For some people this may include tomatoes and tomato products, chili peppers, onions and garlic, and horseradish. Fruits Any fruits prepared with added fat. Any fruits that cause symptoms. For some people this may include citrus fruits, such as oranges, grapefruit, pineapple, and lemons. Meats and other protein foods High-fat meats, such as fatty beef or pork, hot dogs, ribs, ham, sausage, salami and bacon. Fried meat or protein, including fried fish and fried chicken. Nuts and nut butters.  Dairy Whole milk and chocolate milk. Sour cream. Cream. Ice cream. Cream cheese. Milk shakes. Beverages Coffee and tea, with or without caffeine. Carbonated beverages. Sodas. Energy drinks. Fruit juice made with acidic fruits (such as orange or grapefruit). Tomato juice. Alcoholic drinks. Fats and oils Butter. Margarine. Shortening. Ghee. Sweets  and desserts Chocolate and cocoa. Donuts. Seasoning and other foods Pepper. Peppermint and spearmint. Any condiments, herbs, or seasonings that cause symptoms. For some people, this may include curry, hot sauce, or vinegar-based salad dressings. Summary  When you have gastroesophageal reflux disease (GERD), food and lifestyle choices are very important to help ease the discomfort of GERD.  Eat frequent, small meals instead of three large meals each day. Eat your meals slowly, in a relaxed setting. Avoid bending over or lying down until 2-3 hours after eating.  Limit high-fat foods such as fatty meat or fried foods. This information is not intended to replace advice given to you by your health care provider. Make sure you discuss any questions you have with your health care provider. Document Released: 08/04/2005 Document Revised: 08/05/2016 Document Reviewed: 08/05/2016 Elsevier Interactive Patient Education  [employer].

## 2018-07-29 NOTE — Progress Notes (Signed)
Chief Complaint  Patient presents with  . Sore Throat    mostly on the left side. Started Sat am, going on 6 days. Hurts when she swallows, feels better when he drinks something cold. No fevers. Has not improved. Also is taking macrobid for UTI given by Dr.Horvath.    Left sided sore throat started 12/7, woke up at 4-5 am with it.  She took ibuprofen, ate, ran 1/2 marathon.  Throat discomfort hasn't gotten better, but hasn't gotten worse since then either. Ibuprofen helps some. Tried salt water gargle this morning, doesn't think she did it long enough. She denies runny nose, congestion, cough, fever.  She is having acid reflux when doing yoga (downward dog is when it is the worst, has had to come out of the pose due to reflux).  Did yoga on Sunday, Tuesday and yesterday.  Yesterday, prior to yoga [facility] which helped.  Denies significant changes in diet.  Drinks a strong coffee in the morning, 2 cups. Some lemonade recently.  Chews some peppermint gum Occasional wine/beer +chocolate  Occasionally wakes up at night with heartburn, about once a week.  New boyfriend, new sexual partner 2 am following intercourse this past weekend she had urinary urgency, frequency. She called her GYN, called in macrobid, took x 4 doses so far. Symptoms are resolving.   PMH, PSH, SH reviewed  Outpatient Encounter Medications as of 07/29/2018  Medication Sig Note  . Ibuprofen 200 MG CAPS Take 2 capsules by mouth.  07/29/2018: Last dose 1:30pm  . nitrofurantoin, macrocrystal-monohydrate, (MACROBID) 100 MG capsule Macrobid 100 mg capsule  Take 1 capsule twice a day by oral route for 7 days.   . famotidine (PEPCID) 20 MG tablet Take 20 mg by mouth daily.    No facility-administered encounter medications on file as of 07/29/2018.    Allergies  Allergen Reactions  . Benadryl [Diphenhydramine Hcl (Sleep)]     topical  . Poison Ivy Extract [Poison Ivy Extract]   . Penicillins Rash   ROS: no  fever, chills, headaches, dizziness, ear pain, cough, shortness of breath, chest pain. +sore throat, reflux and recent urinary symptoms per HPI.  No vaginal discharge, bleeding, rash or other concerns   PHYSICAL EXAM:  BP 100/60   Pulse (!) 56   Temp 98.6 F (37 C) (Tympanic)   Ht 5' 3.75" (1.619 m)   Wt 115 lb (52.2 kg)   LMP 12/30/2014   BMI 19.89 kg/m   Well developed, well-appearing, pleasant female in no distress HEENT: PERRL, EOMI, conjunctiva and sclera are clear. TM's and EAC's are normal.  Nasal mucosa--mod-severe edema with clear-white mucus Throat--white mucus noted posteriorly. Some erythema of L>R anterior tonsillar pillar. Tonsils are normal. Neck: Some tender anterior cervical lymphadenoapthy, more tender on the left than right. Heart: regular rate and rhythm Lungs: clear bilaterally Back: no CVA tenderness Abdomen: soft, nontender no oganomegaly or mass.  No epigastric or suprapubic tenderness Extremities: no edema Skin: normal turgor, no rash Psych: normal mood, affect, hygiene and grooming Neuro: alert and oriented, cranial nerves intact, normal gait  Rapid strep negative    ASSESSMENT/PLAN:  Sore throat - related to PND, from URI. GERD may also contribute, but suspect mostly from PND - Plan: Rapid Strep A  Viral upper respiratory infection - supportive measures reviewed  Gastroesophageal reflux disease without esophagitis - reflux precautions, diet and OTC meds reviewed in detail  Cystitis - resolving with ABX from GYN. Reminded to void after intercourse, consider cranberry  juice early. f/u if sx don't completely resolve   Use sudafed (decongestant) to help with the nasal congestion and postnasal drainage which is contributing to your sore throat. Continue salt water gargles, and use tylenol and/or ibuprofen as needed for pain. Reflux can contribute to sore throat, but you have drainage visible that is also a factor.   Take Prilosec OTC once daily  for the next 2 weeks.  Take it prior to dinner. After 2 weeks, if symptoms are improved, you can try taking it every other day, or just prior to meals which you know will trigger heartburn. Or you can switch back to Pepcid 20mg  before dinner (regularly or just as needed).

## 2018-08-18 DIAGNOSIS — E559 Vitamin D deficiency, unspecified: Secondary | ICD-10-CM

## 2018-08-18 HISTORY — DX: Vitamin D deficiency, unspecified: E55.9

## 2018-08-31 DIAGNOSIS — Z681 Body mass index (BMI) 19 or less, adult: Secondary | ICD-10-CM | POA: Diagnosis not present

## 2018-08-31 DIAGNOSIS — N760 Acute vaginitis: Secondary | ICD-10-CM | POA: Diagnosis not present

## 2019-01-19 DIAGNOSIS — N760 Acute vaginitis: Secondary | ICD-10-CM | POA: Diagnosis not present

## 2019-01-19 DIAGNOSIS — Z681 Body mass index (BMI) 19 or less, adult: Secondary | ICD-10-CM | POA: Diagnosis not present

## 2019-01-19 DIAGNOSIS — Z113 Encounter for screening for infections with a predominantly sexual mode of transmission: Secondary | ICD-10-CM | POA: Diagnosis not present

## 2019-01-19 DIAGNOSIS — R102 Pelvic and perineal pain: Secondary | ICD-10-CM | POA: Diagnosis not present

## 2019-02-15 DIAGNOSIS — A749 Chlamydial infection, unspecified: Secondary | ICD-10-CM | POA: Diagnosis not present

## 2019-02-15 DIAGNOSIS — Z681 Body mass index (BMI) 19 or less, adult: Secondary | ICD-10-CM | POA: Diagnosis not present

## 2019-02-17 DIAGNOSIS — A749 Chlamydial infection, unspecified: Secondary | ICD-10-CM | POA: Diagnosis not present

## 2019-04-22 DIAGNOSIS — Z681 Body mass index (BMI) 19 or less, adult: Secondary | ICD-10-CM | POA: Diagnosis not present

## 2019-04-22 DIAGNOSIS — Z124 Encounter for screening for malignant neoplasm of cervix: Secondary | ICD-10-CM | POA: Diagnosis not present

## 2019-04-22 DIAGNOSIS — Z01419 Encounter for gynecological examination (general) (routine) without abnormal findings: Secondary | ICD-10-CM | POA: Diagnosis not present

## 2019-04-27 LAB — HM PAP SMEAR: HM Pap smear: NEGATIVE

## 2019-04-28 ENCOUNTER — Encounter: Payer: Self-pay | Admitting: Medical

## 2019-05-05 ENCOUNTER — Telehealth: Payer: Self-pay | Admitting: Medical

## 2019-05-05 NOTE — Telephone Encounter (Signed)
Requested records received from [facility] °

## 2019-05-10 ENCOUNTER — Encounter: Payer: Self-pay | Admitting: Medical

## 2019-05-18 DIAGNOSIS — N870 Mild cervical dysplasia: Secondary | ICD-10-CM | POA: Diagnosis not present

## 2019-05-18 DIAGNOSIS — Z681 Body mass index (BMI) 19 or less, adult: Secondary | ICD-10-CM | POA: Diagnosis not present

## 2019-06-07 ENCOUNTER — Telehealth: Payer: Self-pay | Admitting: Medical

## 2019-06-07 NOTE — Telephone Encounter (Signed)
Pt called and Is coming in for Nov the 12th for her cpe and she would like to come in for fasting labs prior if that is ok, pt can be reached at [phone]

## 2019-06-08 ENCOUNTER — Other Ambulatory Visit: Payer: Self-pay | Admitting: Medical

## 2019-06-08 DIAGNOSIS — E559 Vitamin D deficiency, unspecified: Secondary | ICD-10-CM

## 2019-06-08 DIAGNOSIS — Z1159 Encounter for screening for other viral diseases: Secondary | ICD-10-CM

## 2019-06-08 DIAGNOSIS — Z0000 Encounter for general adult medical examination without abnormal findings: Secondary | ICD-10-CM

## 2019-06-08 DIAGNOSIS — Z8249 Family history of ischemic heart disease and other diseases of the circulatory system: Secondary | ICD-10-CM | POA: Insufficient documentation

## 2019-06-08 NOTE — Telephone Encounter (Signed)
Pt said no to STD she has that done at her OBGYN and ok to the Vit d and she would like a CRP she would like to everything that can be tested, she is wanting all the blood work that can be done

## 2019-06-08 NOTE — Telephone Encounter (Signed)
Labs are in

## 2019-06-08 NOTE — Telephone Encounter (Signed)
She said she wanted everything checked, not for sure about the STD panel, I think she did mention the VIT D

## 2019-06-08 NOTE — Telephone Encounter (Signed)
I got her scheduled for Nov the 9th

## 2019-06-08 NOTE — Telephone Encounter (Signed)
So with "everything", did she specifically say yes to STD screen or No to STD screen?

## 2019-06-08 NOTE — Telephone Encounter (Signed)
That is fine.  Typically I would order comprehensive metabolic panel, CBC blood count, lipid panel cholesterol.  Are there other labs that she would want to see, such as vitamin D level, STD screen, other?

## 2019-06-20 ENCOUNTER — Other Ambulatory Visit: Payer: BC Managed Care – PPO

## 2019-06-20 ENCOUNTER — Other Ambulatory Visit: Payer: Self-pay

## 2019-06-20 DIAGNOSIS — Z1159 Encounter for screening for other viral diseases: Secondary | ICD-10-CM

## 2019-06-20 DIAGNOSIS — Z8249 Family history of ischemic heart disease and other diseases of the circulatory system: Secondary | ICD-10-CM | POA: Diagnosis not present

## 2019-06-20 DIAGNOSIS — E559 Vitamin D deficiency, unspecified: Secondary | ICD-10-CM | POA: Diagnosis not present

## 2019-06-20 DIAGNOSIS — R945 Abnormal results of liver function studies: Secondary | ICD-10-CM | POA: Diagnosis not present

## 2019-06-20 DIAGNOSIS — Z0000 Encounter for general adult medical examination without abnormal findings: Secondary | ICD-10-CM

## 2019-06-20 DIAGNOSIS — E7800 Pure hypercholesterolemia, unspecified: Secondary | ICD-10-CM | POA: Diagnosis not present

## 2019-06-21 ENCOUNTER — Other Ambulatory Visit: Payer: BC Managed Care – PPO

## 2019-06-21 ENCOUNTER — Other Ambulatory Visit: Payer: Self-pay | Admitting: Medical

## 2019-06-21 DIAGNOSIS — B1920 Unspecified viral hepatitis C without hepatic coma: Secondary | ICD-10-CM

## 2019-06-21 DIAGNOSIS — Z0000 Encounter for general adult medical examination without abnormal findings: Secondary | ICD-10-CM

## 2019-06-21 LAB — LIPID PANEL
Chol/HDL Ratio: 2.8 ratio (ref 0.0–4.4)
Cholesterol, Total: 271 mg/dL — ABNORMAL HIGH (ref 100–199)
HDL: 96 mg/dL (ref >39)
LDL Chol Calc (NIH): 159 mg/dL — ABNORMAL HIGH (ref 0–99)
Triglycerides: 98 mg/dL (ref 0–149)
VLDL Cholesterol Cal: 16 mg/dL (ref 5–40)

## 2019-06-21 LAB — COMPREHENSIVE METABOLIC PANEL
ALT: 15 IU/L (ref 0–32)
AST: 25 IU/L (ref 0–40)
Albumin/Globulin Ratio: 2.3 — ABNORMAL HIGH (ref 1.2–2.2)
Albumin: 4.9 g/dL (ref 3.8–4.9)
Alkaline Phosphatase: 57 IU/L (ref 39–117)
BUN/Creatinine Ratio: 11 (ref 9–23)
BUN: 12 mg/dL (ref 6–24)
Bilirubin Total: 0.5 mg/dL (ref 0.0–1.2)
CO2: 26 mmol/L (ref 20–29)
Calcium: 9.9 mg/dL (ref 8.7–10.2)
Chloride: 101 mmol/L (ref 96–106)
Creatinine, Ser: 1.12 mg/dL — ABNORMAL HIGH (ref 0.57–1.00)
GFR calc Af Amer: 64 mL/min/{1.73_m2} (ref >59)
GFR calc non Af Amer: 55 mL/min/{1.73_m2} — ABNORMAL LOW (ref >59)
Globulin, Total: 2.1 g/dL (ref 1.5–4.5)
Glucose: 106 mg/dL — ABNORMAL HIGH (ref 65–99)
Potassium: 4.6 mmol/L (ref 3.5–5.2)
Sodium: 139 mmol/L (ref 134–144)
Total Protein: 7 g/dL (ref 6.0–8.5)

## 2019-06-21 LAB — CBC WITH DIFFERENTIAL/PLATELET
Basophils Absolute: 0.1 10*3/uL (ref 0.0–0.2)
Basos: 1 %
EOS (ABSOLUTE): 0.1 10*3/uL (ref 0.0–0.4)
Eos: 3 %
Hematocrit: 41.9 % (ref 34.0–46.6)
Hemoglobin: 13.7 g/dL (ref 11.1–15.9)
Immature Grans (Abs): 0 10*3/uL (ref 0.0–0.1)
Immature Granulocytes: 0 %
Lymphocytes Absolute: 1.2 10*3/uL (ref 0.7–3.1)
Lymphs: 27 %
MCH: 30.3 pg (ref 26.6–33.0)
MCHC: 32.7 g/dL (ref 31.5–35.7)
MCV: 93 fL (ref 79–97)
Monocytes Absolute: 0.4 10*3/uL (ref 0.1–0.9)
Monocytes: 10 %
Neutrophils Absolute: 2.5 10*3/uL (ref 1.4–7.0)
Neutrophils: 59 %
Platelets: 296 10*3/uL (ref 150–450)
RBC: 4.52 x10E6/uL (ref 3.77–5.28)
RDW: 12.7 % (ref 11.7–15.4)
WBC: 4.3 10*3/uL (ref 3.4–10.8)

## 2019-06-21 LAB — HEPATITIS C ANTIBODY: Hep C Virus Ab: 1.1 s/co ratio — ABNORMAL HIGH (ref 0.0–0.9)

## 2019-06-21 LAB — C-REACTIVE PROTEIN: CRP: <1 mg/L (ref 0–10)

## 2019-06-21 NOTE — Progress Notes (Signed)
Done

## 2019-06-22 LAB — HCV RNA QUANT RFLX ULTRA OR GENOTYP: HCV Quant Baseline: NOT DETECTED IU/mL

## 2019-06-22 LAB — TSH: TSH: 1.21 u[IU]/mL (ref 0.450–4.500)

## 2019-06-22 LAB — VITAMIN D 25 HYDROXY (VIT D DEFICIENCY, FRACTURES): Vit D, 25-Hydroxy: 29.3 ng/mL — ABNORMAL LOW (ref 30.0–100.0)

## 2019-06-27 ENCOUNTER — Other Ambulatory Visit: Payer: BLUE CROSS/BLUE SHIELD

## 2019-06-30 ENCOUNTER — Ambulatory Visit (INDEPENDENT_AMBULATORY_CARE_PROVIDER_SITE_OTHER): Payer: BC Managed Care – PPO | Admitting: Medical

## 2019-06-30 ENCOUNTER — Encounter: Payer: Self-pay | Admitting: Medical

## 2019-06-30 ENCOUNTER — Other Ambulatory Visit: Payer: Self-pay

## 2019-06-30 VITALS — BP 110/74 | HR 56 | Temp 96.9°F | Ht 64.0 in | Wt 117.8 lb

## 2019-06-30 DIAGNOSIS — Z139 Encounter for screening, unspecified: Secondary | ICD-10-CM

## 2019-06-30 DIAGNOSIS — Z131 Encounter for screening for diabetes mellitus: Secondary | ICD-10-CM | POA: Diagnosis not present

## 2019-06-30 DIAGNOSIS — Z136 Encounter for screening for cardiovascular disorders: Secondary | ICD-10-CM

## 2019-06-30 DIAGNOSIS — R7989 Other specified abnormal findings of blood chemistry: Secondary | ICD-10-CM

## 2019-06-30 DIAGNOSIS — Z1211 Encounter for screening for malignant neoplasm of colon: Secondary | ICD-10-CM

## 2019-06-30 DIAGNOSIS — E7800 Pure hypercholesterolemia, unspecified: Secondary | ICD-10-CM

## 2019-06-30 DIAGNOSIS — Z0000 Encounter for general adult medical examination without abnormal findings: Secondary | ICD-10-CM

## 2019-06-30 DIAGNOSIS — Z7189 Other specified counseling: Secondary | ICD-10-CM

## 2019-06-30 DIAGNOSIS — Z8249 Family history of ischemic heart disease and other diseases of the circulatory system: Secondary | ICD-10-CM | POA: Diagnosis not present

## 2019-06-30 DIAGNOSIS — R739 Hyperglycemia, unspecified: Secondary | ICD-10-CM | POA: Diagnosis not present

## 2019-06-30 DIAGNOSIS — R0789 Other chest pain: Secondary | ICD-10-CM | POA: Insufficient documentation

## 2019-06-30 DIAGNOSIS — R945 Abnormal results of liver function studies: Secondary | ICD-10-CM

## 2019-06-30 DIAGNOSIS — E559 Vitamin D deficiency, unspecified: Secondary | ICD-10-CM

## 2019-06-30 DIAGNOSIS — Z23 Encounter for immunization: Secondary | ICD-10-CM

## 2019-06-30 DIAGNOSIS — Z7185 Encounter for immunization safety counseling: Secondary | ICD-10-CM

## 2019-06-30 LAB — POCT URINALYSIS DIP (PROADVANTAGE DEVICE)
Bilirubin, UA: NEGATIVE
Blood, UA: NEGATIVE
Glucose, UA: NEGATIVE mg/dL
Ketones, POC UA: NEGATIVE mg/dL
Leukocytes, UA: NEGATIVE
Nitrite, UA: NEGATIVE
Protein Ur, POC: NEGATIVE mg/dL
Specific Gravity, Urine: 1.01
Urobilinogen, Ur: NEGATIVE
pH, UA: 6 (ref 5.0–8.0)

## 2019-06-30 NOTE — Patient Instructions (Signed)
We discussed several issues today  Creatinine, kidney marker elevated.  This is likely due to being slightly dehydrated at the time of your blood draw.  This is improved from similar finding a couple years ago.  I recommend you drink plenty of water throughout the day, preferably 1.5 L minimum per day.  Drink enough clear fluids to where your urine is clear throughout the day.  We will continue to monitor this at least yearly on your physicals.  Avoid taking medications like ibuprofen and Aleve regularly or daily.  Also avoid excessive alcohol use.  Hepatitis C antibody positive recently with negative viral load.  She notes possible negative hepatitis C antibody 2018.  She will try to get those records to me.  We will check hepatitis B serologies today.  We discussed possible hepatitis a vaccination  Return at your convenience for Shingrix 1 and 2 vaccines 2 months apart.  Get a yearly flu shot  See your dentist, eye doctor, and gynecologist yearly for routine preventative care  I recommend you take a vitamin D supplement such as 2000 units daily, eat fish regularly  Please complete the stool cards on 3 separate days and return days for intermediate colon cancer screening.  We will make referral to cardiology for further evaluation of your recent symptom  It seems that you have an underlying genetic predisposition for hyperlipidemia.  You had an NMR LipoProfile in 2013 that was abnormal with high LDL particle numbers and your recent lipid panel was also abnormal.  Please discuss this with cardiology  We will screen you for diabetes today with a hemoglobin A1c       I have included other useful information below for your review.  Preventative Care for Adults - Female      MAINTAIN REGULAR HEALTH EXAMS:  A routine yearly physical is a good way to check in with your primary care provider about your health and preventive screening. It is also an opportunity to share updates about your  health and any concerns you have, and receive a thorough all-over exam.   Most health insurance companies pay for at least some preventative services.  Check with your health plan for specific coverages.  WHAT PREVENTATIVE SERVICES DO WOMEN NEED?  Adult women should have their weight and blood pressure checked regularly.   Women age 46 and older should have their cholesterol levels checked regularly.  Women should be screened for cervical cancer with a Pap smear and pelvic exam beginning at either age 31, or 3 years after they become sexually activity.    Breast cancer screening generally begins at age 58 with a mammogram and breast exam by your primary care provider.    Beginning at age 16 and continuing to age 52, women should be screened for colorectal cancer.  Certain people may need continued testing until age 73.  Updating vaccinations is part of preventative care.  Vaccinations help protect against diseases such as the flu.  Osteoporosis is a disease in which the bones lose minerals and strength as we age. Women ages 32 and over should discuss this with their caregivers, as should women after menopause who have other risk factors.  Lab tests are generally done as part of preventative care to screen for anemia and blood disorders, to screen for problems with the kidneys and liver, to screen for bladder problems, to check blood sugar, and to check your cholesterol level.  Preventative services generally include counseling about diet, exercise, avoiding tobacco, drugs, excessive  alcohol consumption, and sexually transmitted infections.    GENERAL RECOMMENDATIONS FOR GOOD HEALTH:  Healthy diet:  Eat a variety of foods, including fruit, vegetables, animal or vegetable protein, such as meat, fish, chicken, and eggs, or beans, lentils, tofu, and grains, such as rice.  Drink plenty of water daily.  Decrease saturated fat in the diet, avoid lots of red meat, processed foods, sweets,  fast foods, and fried foods.  Exercise:  Aerobic exercise helps maintain good heart health. At least 30-40 minutes of moderate-intensity exercise is recommended. For example, a brisk walk that increases your heart rate and breathing. This should be done on most days of the week.   Find a type of exercise or a variety of exercises that you enjoy so that it becomes a part of your daily life.  Examples are running, walking, swimming, water aerobics, and biking.  For motivation and support, explore group exercise such as aerobic class, spin class, Zumba, Yoga,or  martial arts, etc.    Set exercise goals for yourself, such as a certain weight goal, walk or run in a race such as a 5k walk/run.  Speak to your primary care provider about exercise goals.  Disease prevention:  If you smoke or chew tobacco, find out from your caregiver how to quit. It can literally save your life, no matter how long you have been a tobacco user. If you do not use tobacco, never begin.   Maintain a healthy diet and normal weight. Increased weight leads to problems with blood pressure and diabetes.   The Body Mass Index or BMI is a way of measuring how much of your body is fat. Having a BMI above 27 increases the risk of heart disease, diabetes, hypertension, stroke and other problems related to obesity. Your caregiver can help determine your BMI and based on it develop an exercise and dietary program to help you achieve or maintain this important measurement at a healthful level.  High blood pressure causes heart and blood vessel problems.  Persistent high blood pressure should be treated with medicine if weight loss and exercise do not work.   Fat and cholesterol leaves deposits in your arteries that can block them. This causes heart disease and vessel disease elsewhere in your body.  If your cholesterol is found to be high, or if you have heart disease or certain other medical conditions, then you may need to have your  cholesterol monitored frequently and be treated with medication.   Ask if you should have a cardiac stress test if your history suggests this. A stress test is a test done on a treadmill that looks for heart disease. This test can find disease prior to there being a problem.  Menopause can be associated with physical symptoms and risks. Hormone replacement therapy is available to decrease these. You should talk to your caregiver about whether starting or continuing to take hormones is right for you.   Osteoporosis is a disease in which the bones lose minerals and strength as we age. This can result in serious bone fractures. Risk of osteoporosis can be identified using a bone density scan. Women ages 65 and over should discuss this with their caregivers, as should women after menopause who have other risk factors. Ask your caregiver whether you should be taking a calcium supplement and Vitamin D, to reduce the rate of osteoporosis.   Avoid drinking alcohol in excess (more than two drinks per day).  Avoid use of street drugs. Do not share  needles with anyone. Ask for professional help if you need assistance or instructions on stopping the use of alcohol, cigarettes, and/or drugs.  Brush your teeth twice a day with fluoride toothpaste, and floss once a day. Good oral hygiene prevents tooth decay and gum disease. The problems can be painful, unattractive, and can cause other health problems. Visit your dentist for a routine oral and dental check up and preventive care every 6-12 months.   Look at your skin regularly.  Use a mirror to look at your back. Notify your caregivers of changes in moles, especially if there are changes in shapes, colors, a size larger than a pencil eraser, an irregular border, or development of new moles.  Safety:  Use seatbelts 100% of the time, whether driving or as a passenger.  Use safety devices such as hearing protection if you work in environments with loud noise or  significant background noise.  Use safety glasses when doing any work that could send debris in to the eyes.  Use a helmet if you ride a bike or motorcycle.  Use appropriate safety gear for contact sports.  Talk to your caregiver about gun safety.  Use sunscreen with a SPF (or skin protection factor) of 15 or greater.  Lighter skinned people are at a greater risk of skin cancer. Don't forget to also wear sunglasses in order to protect your eyes from too much damaging sunlight. Damaging sunlight can accelerate cataract formation.   Practice safe sex. Use condoms. Condoms are used for birth control and to help reduce the spread of sexually transmitted infections (or STIs).  Some of the STIs are gonorrhea (the clap), chlamydia, syphilis, trichomonas, herpes, HPV (human papilloma virus) and HIV (human immunodeficiency virus) which causes AIDS. The herpes, HIV and HPV are viral illnesses that have no cure. These can result in disability, cancer and death.   Keep carbon monoxide and smoke detectors in your home functioning at all times. Change the batteries every 6 months or use a model that plugs into the wall.   Vaccinations:  Stay up to date with your tetanus shots and other required immunizations. You should have a booster for tetanus every 10 years. Be sure to get your flu shot every year, since 5%-20% of the [place] population comes down with the flu. The flu vaccine changes each year, so being vaccinated once is not enough. Get your shot in the fall, before the flu season peaks.   Other vaccines to consider:  Human Papilloma Virus or HPV causes cancer of the cervix, and other infections that can be transmitted from person to person. There is a vaccine for HPV, and females should get immunized between the ages of 7611 and 6326. It requires a series of 3 shots.   Pneumococcal vaccine to protect against certain types of pneumonia.  This is normally recommended for adults age 55 or older.  However, adults  younger than 55 years old with certain underlying conditions such as diabetes, heart or lung disease should also receive the vaccine.  Shingles vaccine to protect against Varicella Zoster if you are older than age 55, or younger than 55 years old with certain underlying illness.  If you have not had the Shingrix vaccine, please call your insurer to inquire about coverage for the Shingrix vaccine given in 2 doses.   Some insurers cover this vaccine after age 55, some cover this after age 55.  If your insurer covers this, then call to schedule appointment to have this vaccine  here  Hepatitis A vaccine to protect against a form of infection of the liver by a virus acquired from food.  Hepatitis B vaccine to protect against a form of infection of the liver by a virus acquired from blood or body fluids, particularly if you work in health care.  If you plan to travel internationally, check with your local health department for specific vaccination recommendations.  Cancer Screening:  Breast cancer screening is essential to preventive care for women. All women age 42 and older should perform a breast self-exam every month. At age 76 and older, women should have their caregiver complete a breast exam each year. Women at ages 45 and older should have a mammogram (x-ray film) of the breasts. Your caregiver can discuss how often you need mammograms.    Cervical cancer screening includes taking a Pap smear (sample of cells examined under a microscope) from the cervix (end of the uterus). It also includes testing for HPV (Human Papilloma Virus, which can cause cervical cancer). Screening and a pelvic exam should begin at age 58, or 3 years after a woman becomes sexually active. Screening should occur every year, with a Pap smear but no HPV testing, up to age 41. After age 52, you should have a Pap smear every 3 years with HPV testing, if no HPV was found previously.   Most routine colon cancer screening begins at  the age of 62. On a yearly basis, doctors may provide special easy to use take-home tests to check for hidden blood in the stool. Sigmoidoscopy or colonoscopy can detect the earliest forms of colon cancer and is life saving. These tests use a small camera at the end of a tube to directly examine the colon. Speak to your caregiver about this at age 46, when routine screening begins (and is repeated every 5 years unless early forms of pre-cancerous polyps or small growths are found).

## 2019-06-30 NOTE — Progress Notes (Signed)
Subjective:   HPI  Kristina Newton is a 53 y.o. female who presents for Chief Complaint  Patient presents with  . Annual Exam    labs are already in     Patient Care Team: , Cleda Mccreedy as PCP - General (Family Medicine) Sees dentist Sees eye doctor Dermatology, Dr. Adron Bene Gynecology, Dr. Marlow Baars   Concerns: Several concerns today.  She came in for labs in advance of today's appt and has lab findings to discuss, including elevated glucose, creatinine, hepatitis lab ab.  She is a Product/process development scientist, has a Psychologist, occupational, and uses app on the phone.   She does very intense workouts.   Heart rates can range 150-190s with intense work outs but resting rate is in the high 40s.   One recent intense interval workout she had an odd chest pressure while around 180 heart rate in an hour long exercise routine.   She has continued to get this same uneasiness in her chest with higher intensity exercise since then.  This was a few weeks ago.  It only happens with high intensity exercise.   No associated SOB, nausea, dizziness or other symptoms.     She recently started taking vit D 2000 u daily.   Declines statin although NMR lipoprofile  In the past was abnormal and recent lipid panel abnormal.  Past Medical History:  Diagnosis Date  . Creatinine elevation 2018   possibly due to dehydration  . Fibrocystic breast disease   . Lipid screening    NMR lipoprofile - hx/o elevated LDL particle number and abnormal other particle, improved from 2013 - 2015.  low risk, no indicated therapy  . Vitamin D deficiency 2020    Past Surgical History:  Procedure Laterality Date  . AUGMENTATION MAMMAPLASTY Bilateral 06/2016   After 2017's Mammogram   . BREAST BIOPSY    . BREAST SURGERY  2007   breast biopsy  . COLONOSCOPY  06/2015   normal, repeat 2026.  Dr. Erick Blinks  . DIAGNOSTIC MAMMOGRAM  8/11   normal    Social History   Socioeconomic History  . Marital status: Divorced    Spouse name:  Not on file  . Number of children: Not on file  . Years of education: Not on file  . Highest education level: Not on file  Occupational History  . Not on file  Social Needs  . Financial resource strain: Not on file  . Food insecurity    Worry: Not on file    Inability: Not on file  . Transportation needs    Medical: Not on file    Non-medical: Not on file  Tobacco Use  . Smoking status: Never Smoker  . Smokeless tobacco: Never Used  Substance and Sexual Activity  . Alcohol use: Yes    Alcohol/week: 1.0 standard drinks    Types: 1 Glasses of wine per week    Comment: rarely drinks 1 per week  . Drug use: No  . Sexual activity: Yes  Lifestyle  . Physical activity    Days per week: Not on file    Minutes per session: Not on file  . Stress: Not on file  Relationships  . Social Musician on phone: Not on file    Gets together: Not on file    Attends religious service: Not on file    Active member of club or organization: Not on file    Attends meetings of clubs or organizations: Not on  file    Relationship status: Not on file  . Intimate partner violence    Fear of current or ex partner: Not on file    Emotionally abused: Not on file    Physically abused: Not on file    Forced sexual activity: Not on file  Other Topics Concern  . 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  2 children, Isabelle Course(Lydia, danced for 12 years/Nans, attends [employer]   Son (Sam) not athletic, going to [employer].  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     06/2019    Family History  Problem Relation Age of Onset  . Cancer Mother        basal cell cancer  . Hearing loss Mother   . Macular degeneration Mother   . Cancer Father        basal cell cancer  . Hypertension Father   . Hyperlipidemia Father   . Heart disease Father        arrythmia, stent/CAD?  . Depression Sister   . Stroke Neg Hx    . Diabetes Neg Hx   . Colon polyps Neg Hx   . Colon cancer Neg Hx   . Breast cancer Neg Hx      Current Outpatient Medications:  .  cholecalciferol (VITAMIN D3) 25 MCG (1000 UT) tablet, Take 2,000 Units by mouth daily., Disp: , Rfl:  .  famotidine (PEPCID) 20 MG tablet, Take 20 mg by mouth daily., Disp: , Rfl:  .  Ibuprofen 200 MG CAPS, Take 2 capsules by mouth. , Disp: , Rfl:  .  vitamin B-12 (CYANOCOBALAMIN) 1000 MCG tablet, Take 1,000 mcg by mouth daily., Disp: , Rfl:  .  nitrofurantoin, macrocrystal-monohydrate, (MACROBID) 100 MG capsule, Macrobid 100 mg capsule  Take 1 capsule twice a day by oral route for 7 days., Disp: , Rfl:   Allergies  Allergen Reactions  . Benadryl [Diphenhydramine Hcl (Sleep)]     topical  . Poison Ivy Extract [Poison Ivy Extract]       Reviewed their medical, surgical, family, social, medication, and allergy history and updated chart as appropriate.   Review of Systems Constitutional: -fever, -chills, -sweats, -unexpected weight change, -decreased appetite, -fatigue Allergy: -sneezing, -itching, -congestion Dermatology: -changing moles, --rash, -lumps ENT: -runny nose, -ear pain, -sore throat, -hoarseness, -sinus pain, -teeth pain, - ringing in ears, -hearing loss, -nosebleeds Cardiology: +chest pain, -palpitations, -swelling, -difficulty breathing when lying flat, -waking up short of breath Respiratory: -cough, -shortness of breath, -difficulty breathing with exercise or exertion, -wheezing, -coughing up blood Gastroenterology: -abdominal pain, -nausea, -vomiting, -diarrhea, -constipation, -blood in stool, -changes in bowel movement, -difficulty swallowing or eating Hematology: -bleeding, -bruising  Musculoskeletal: -joint aches, -muscle aches, -joint swelling, -back pain, -neck pain, -cramping, -changes in gait Ophthalmology: denies vision changes, eye redness, itching, discharge Urology: -burning with urination, -difficulty urinating, -blood in  urine, -urinary frequency, -urgency, -incontinence Neurology: -headache, -weakness, -tingling, -numbness, -memory loss, -falls, -dizziness Psychology: -depressed mood, -agitation, -sleep problems Breast/gyn: -breast tendnerss, -discharge, -lumps, -vaginal discharge,- irregular periods, -heavy periods     Objective:  BP 110/74   Pulse (!) 56   Temp (!) 96.9 F (36.1 C)   Ht 5\' 4"  (1.626 m)   Wt 117 lb 12.8 oz (53.4 kg)   LMP 12/30/2014   SpO2 99%   BMI 20.22 kg/m   General appearance: alert, no distress, WD/WN, Caucasian female Skin: Scattered macules,  no worrisome lesions HEENT: normocephalic, conjunctiva/corneas normal, sclerae anicteric, PERRLA, EOMi, nares patent, no discharge or erythema, pharynx normal Neck: supple, no lymphadenopathy, no thyromegaly, no masses, normal ROM, no bruits Chest: non tender, normal shape and expansion Heart: RRR, normal S1, S2, no murmurs Lungs: CTA bilaterally, no wheezes, rhonchi, or rales Abdomen: +bs, soft, non tender, non distended, no masses, no hepatomegaly, no splenomegaly, no bruits Back: non tender, normal ROM, no scoliosis Musculoskeletal: upper extremities non tender, no obvious deformity, normal ROM throughout, lower extremities non tender, no obvious deformity, normal ROM throughout Extremities: no edema, no cyanosis, no clubbing Pulses: 2+ symmetric, upper and lower extremities, normal cap refill Neurological: alert, oriented x 3, CN2-12 intact, strength normal upper extremities and lower extremities, sensation normal throughout, DTRs 2+ throughout, no cerebellar signs, gait normal Psychiatric: normal affect, behavior normal, pleasant  Breast/gyn/rectal - deferred to gynecology   EKG: Indication physical, screening for heart disease, chest pressure, rate 47 bpm, PR 142 ms, QRS 84 ms, QTC 389 ms, axis 37 degrees, sinus bradycardia, no prior EKG to compare   Assessment and Plan :   Encounter Diagnoses  Name Primary?  . Encounter  for health maintenance examination in adult Yes  . Screen for colon cancer   . Screening for condition   . Hyperglycemia   . Vaccine counseling   . Need for shingles vaccine   . Vitamin D deficiency   . Family history of heart disease   . Elevated serum creatinine   . Elevated LDL cholesterol level   . Screening for heart disease   . Screening for diabetes mellitus   . Chest pressure   . Liver function test abnormality     Physical exam - discussed and counseled on healthy lifestyle, diet, exercise, preventative care, vaccinations, sick and well care, proper use of emergency dept and after hours care, and addressed their concerns.    Health screening: Advised they see their eye doctor yearly for routine vision care. Advised they see their dentist yearly for routine dental care including hygiene visits twice yearly. See your gynecologist yearly for routine gynecological care.   Vaccinations: She notes flu shot done back in September at [facility] is up-to-date  Counseled on Shingrix.  She is agreeable.  She will return for this on another date  We discussed possibly updating hepatitis A vaccine depending on the outcome of the hepatitis B labs.    Cancer screening I reviewed her December 2019 mammogram.  She is scheduled for upcoming mammogram this December  I reviewed her September 2020 - Pap smear result from gynecology  I reviewed her 06/2015 normal colonoscopy from Dr. Hilarie Fredrickson.  She will take home stool cards for intermittent screening  She will follow-up with dermatology for routine surveillance for skin cancer screening    Separate significant chronic issues discussed:  We discussed several issues today  Creatinine, kidney marker elevated.  This is likely due to being slightly dehydrated at the time of your blood draw.  This is improved from similar finding a couple years ago.  I recommend you drink plenty of water throughout the day, preferably 1.5 L  minimum per day.  Drink enough clear fluids to where your urine is clear throughout the day.  We will continue to monitor this at least yearly on your physicals.  Avoid taking medications like ibuprofen and Aleve regularly or daily.  Also avoid excessive alcohol use.  Hepatitis C antibody positive recently with negative viral load.  She notes  possible negative hepatitis C antibody 2018.  She will try to get those records to me.  We will check hepatitis B serologies today.  We discussed possible hepatitis a vaccination  Return at your convenience for Shingrix 1 and 2 vaccines 2 months apart.  Get a yearly flu shot  See your dentist, eye doctor, and gynecologist yearly for routine preventative care  I recommend you take a vitamin D supplement such as 2000 units daily, eat fish regularly  Please complete the stool cards on 3 separate days and return days for intermediate colon cancer screening.  We will make referral to cardiology for further evaluation of your recent symptom  It seems that you have an underlying genetic predisposition for hyperlipidemia.  You had an NMR LipoProfile in 2013 that was abnormal with high LDL particle numbers and your recent lipid panel was also abnormal.  Please discuss this with cardiology  We will screen you for diabetes today with a hemoglobin A1c   Spent >45 min in additional time prior to today's visit with phone calls with lab abnormalities leading up to today's visit and in discussion of additional problems issues today.     Arietta was seen today for annual exam.  Diagnoses and all orders for this visit:  Encounter for health maintenance examination in adult -     Hepatitis B surface antibody -     Hepatitis B surface antigen -     Hemoglobin A1c -     EKG 12-Lead -     POCT Urinalysis DIP (Proadvantage Device)  Screen for colon cancer  Screening for condition -     Hepatitis B surface antibody -     Hepatitis B surface  antigen  Hyperglycemia -     Hemoglobin A1c  Vaccine counseling  Need for shingles vaccine  Vitamin D deficiency  Family history of heart disease -     EKG 12-Lead  Elevated serum creatinine  Elevated LDL cholesterol level -     Ambulatory referral to Cardiology  Screening for heart disease -     EKG 12-Lead -     Ambulatory referral to Cardiology  Screening for diabetes mellitus -     Hemoglobin A1c  Chest pressure -     Ambulatory referral to Cardiology  Liver function test abnormality   Follow-up pending labs, yearly for physical

## 2019-07-01 ENCOUNTER — Other Ambulatory Visit: Payer: Self-pay | Admitting: Family Medicine

## 2019-07-01 DIAGNOSIS — Z1231 Encounter for screening mammogram for malignant neoplasm of breast: Secondary | ICD-10-CM

## 2019-07-01 LAB — HEMOGLOBIN A1C
Est. average glucose Bld gHb Est-mCnc: 108 mg/dL
Hgb A1c MFr Bld: 5.4 % (ref 4.8–5.6)

## 2019-07-01 LAB — HEPATITIS B SURFACE ANTIBODY, QUANTITATIVE: Hepatitis B Surf Ab Quant: 14.1 m[IU]/mL (ref >9.9)

## 2019-07-01 LAB — HEPATITIS B SURFACE ANTIGEN: Hepatitis B Surface Ag: NEGATIVE

## 2019-07-05 DIAGNOSIS — M5441 Lumbago with sciatica, right side: Secondary | ICD-10-CM | POA: Diagnosis not present

## 2019-07-05 DIAGNOSIS — M9903 Segmental and somatic dysfunction of lumbar region: Secondary | ICD-10-CM | POA: Diagnosis not present

## 2019-07-05 DIAGNOSIS — M9905 Segmental and somatic dysfunction of pelvic region: Secondary | ICD-10-CM | POA: Diagnosis not present

## 2019-07-05 DIAGNOSIS — M9902 Segmental and somatic dysfunction of thoracic region: Secondary | ICD-10-CM | POA: Diagnosis not present

## 2019-07-06 DIAGNOSIS — M9905 Segmental and somatic dysfunction of pelvic region: Secondary | ICD-10-CM | POA: Diagnosis not present

## 2019-07-06 DIAGNOSIS — M5441 Lumbago with sciatica, right side: Secondary | ICD-10-CM | POA: Diagnosis not present

## 2019-07-06 DIAGNOSIS — M9903 Segmental and somatic dysfunction of lumbar region: Secondary | ICD-10-CM | POA: Diagnosis not present

## 2019-07-06 DIAGNOSIS — M9902 Segmental and somatic dysfunction of thoracic region: Secondary | ICD-10-CM | POA: Diagnosis not present

## 2019-07-08 DIAGNOSIS — M5441 Lumbago with sciatica, right side: Secondary | ICD-10-CM | POA: Diagnosis not present

## 2019-07-08 DIAGNOSIS — M9902 Segmental and somatic dysfunction of thoracic region: Secondary | ICD-10-CM | POA: Diagnosis not present

## 2019-07-08 DIAGNOSIS — M9903 Segmental and somatic dysfunction of lumbar region: Secondary | ICD-10-CM | POA: Diagnosis not present

## 2019-07-08 DIAGNOSIS — M9905 Segmental and somatic dysfunction of pelvic region: Secondary | ICD-10-CM | POA: Diagnosis not present

## 2019-07-12 DIAGNOSIS — M9905 Segmental and somatic dysfunction of pelvic region: Secondary | ICD-10-CM | POA: Diagnosis not present

## 2019-07-12 DIAGNOSIS — M9903 Segmental and somatic dysfunction of lumbar region: Secondary | ICD-10-CM | POA: Diagnosis not present

## 2019-07-12 DIAGNOSIS — M5441 Lumbago with sciatica, right side: Secondary | ICD-10-CM | POA: Diagnosis not present

## 2019-07-12 DIAGNOSIS — M9902 Segmental and somatic dysfunction of thoracic region: Secondary | ICD-10-CM | POA: Diagnosis not present

## 2019-08-03 ENCOUNTER — Encounter: Payer: BC Managed Care – PPO | Admitting: Internal Medicine

## 2019-08-04 NOTE — Progress Notes (Signed)
This encounter was created in error - please disregard.

## 2019-08-18 ENCOUNTER — Telehealth: Payer: BC Managed Care – PPO | Admitting: Internal Medicine

## 2019-08-24 ENCOUNTER — Other Ambulatory Visit: Payer: Self-pay

## 2019-08-24 ENCOUNTER — Ambulatory Visit
Admission: RE | Admit: 2019-08-24 | Discharge: 2019-08-24 | Disposition: A | Discharge status: Home / Self Care | Payer: BC Managed Care – PPO | Source: Ambulatory Visit | Attending: Family Medicine | Admitting: Family Medicine

## 2019-08-24 DIAGNOSIS — Z1231 Encounter for screening mammogram for malignant neoplasm of breast: Secondary | ICD-10-CM

## 2019-09-16 ENCOUNTER — Ambulatory Visit (INDEPENDENT_AMBULATORY_CARE_PROVIDER_SITE_OTHER): Payer: BC Managed Care – PPO | Admitting: Internal Medicine

## 2019-09-16 ENCOUNTER — Other Ambulatory Visit: Payer: Self-pay

## 2019-09-16 ENCOUNTER — Encounter: Payer: Self-pay | Admitting: Internal Medicine

## 2019-09-16 VITALS — BP 129/83 | HR 73 | Ht 64.0 in | Wt 121.6 lb

## 2019-09-16 DIAGNOSIS — I479 Paroxysmal tachycardia, unspecified: Secondary | ICD-10-CM | POA: Diagnosis not present

## 2019-09-16 DIAGNOSIS — R072 Precordial pain: Secondary | ICD-10-CM

## 2019-09-16 DIAGNOSIS — Z01812 Encounter for preprocedural laboratory examination: Secondary | ICD-10-CM

## 2019-09-16 NOTE — Progress Notes (Signed)
OFFICE CONSULT NOTE  Chief Complaint:  Dyslipidemia, chest pain  Primary Care Physician: Jac Canavan, PA-C  HPI:  Kristina Newton is a 54 y.o. female who is being seen today for the evaluation of dyslipidemia, chest pain at the request of Jac Canavan, PA-C.  This is a pleasant 54 year old female who is self described as extremely physically active.  She trains and actively competes in triathlons.  She generally has a resting heart rate in the 40s.  This past October she had an episode where she was at high level training and noted that her heart rate went up into the 190s and did not come down fairly quickly.  After exercising she felt unwell but cannot describe it any more clearly.  She denied any frank chest pain although at times does get chest pain which is more to the right side of her sternum.  She is still able to exercise.  She feels like her heart races unusually with high-level exercise.  In general though she has been able to do most lower to moderate intensity or long distance exercise without issue.  She does have a history of dyslipidemia in the past with elevated LDL particle numbers in 2013 and an HDL of 59, total cholesterol 238 and LDL-PF 1625.  Calculated LDL was 160, with a normal small LDL particle number.  Subsequently she started her high intensity training and 2014 and most recent lipid numbers show total cholesterol 271, however triglycerides were 98, HDL 96 and LDL 159.  Family history is only significant for father who may have had an arrhythmia, question A. fib, who has a pacemaker.  PMHx:  Past Medical History:  Diagnosis Date  . Creatinine elevation 2018   possibly due to dehydration  . Fibrocystic breast disease   . Lipid screening    NMR lipoprofile - hx/o elevated LDL particle number and abnormal other particle, improved from 2013 - 2015.  low risk, no indicated therapy  . Vitamin D deficiency 2020    Past Surgical History:  Procedure  Laterality Date  . AUGMENTATION MAMMAPLASTY Bilateral 06/2016   After 2017's Mammogram   . BREAST BIOPSY    . BREAST SURGERY  2007   breast biopsy  . COLONOSCOPY  06/2015   normal, repeat 2026.  Dr. Erick Blinks  . DIAGNOSTIC MAMMOGRAM  8/11   normal    FAMHx:  Family History  Problem Relation Age of Onset  . Cancer Mother        basal cell cancer  . Hearing loss Mother   . Macular degeneration Mother   . Cancer Father        basal cell cancer  . Hypertension Father   . Hyperlipidemia Father   . Heart disease Father        arrythmia, stent/CAD?  . Depression Sister   . Stroke Neg Hx   . Diabetes Neg Hx   . Colon polyps Neg Hx   . Colon cancer Neg Hx   . Breast cancer Neg Hx     SOCHx:   reports that she has never smoked. She has never used smokeless tobacco. She reports current alcohol use of about 1.0 standard drinks of alcohol per week. She reports that she does not use drugs.  ALLERGIES:  Allergies  Allergen Reactions  . Benadryl [Diphenhydramine Hcl (Sleep)]     topical  . Poison Ivy Extract [Poison Ivy Extract]     ROS: Pertinent items noted in HPI and remainder  of comprehensive ROS otherwise negative.  HOME MEDS: Current Outpatient Medications on File Prior to Visit  Medication Sig Dispense Refill  . cholecalciferol (VITAMIN D3) 25 MCG (1000 UT) tablet Take 2,000 Units by mouth daily.    . famotidine (PEPCID) 20 MG tablet Take 20 mg by mouth daily.    . Ibuprofen 200 MG CAPS Take 2 capsules by mouth.     . vitamin B-12 (CYANOCOBALAMIN) 1000 MCG tablet Take 1,000 mcg by mouth daily.     No current facility-administered medications on file prior to visit.    LABS/IMAGING: No results found for this or any previous visit (from the past 48 hour(s)). No results found.  LIPID PANEL:    Component Value Date/Time   CHOL 271 (H) 06/20/2019 0903   CHOL 238 (H) 06/15/2012 0856   TRIG 98 06/20/2019 0903   TRIG 94 06/15/2012 0856   HDL 96 06/20/2019 0903    HDL 59 06/15/2012 0856   CHOLHDL 2.8 06/20/2019 0903   CHOLHDL 3.4 01/14/2011 1014   VLDL 18 01/14/2011 1014   LDLCALC 159 (H) 06/20/2019 0903   LDLCALC 160 (H) 06/15/2012 0856    WEIGHTS: Wt Readings from Last 3 Encounters:  09/16/19 121 lb 9.6 oz (55.2 kg)  06/30/19 117 lb 12.8 oz (53.4 kg)  07/29/18 115 lb (52.2 kg)    VITALS: BP 129/83   Pulse 73   Ht 5\' 4"  (1.626 m)   Wt 121 lb 9.6 oz (55.2 kg)   LMP 12/30/2014   SpO2 100%   BMI 20.87 kg/m   EXAM: General appearance: alert and no distress Neck: no carotid bruit, no JVD and thyroid not enlarged, symmetric, no tenderness/mass/nodules Lungs: clear to auscultation bilaterally Heart: regular rate and rhythm, S1, S2 normal, no murmur, click, rub or gallop Abdomen: soft, non-tender; bowel sounds normal; no masses,  no organomegaly Extremities: extremities normal, atraumatic, no cyanosis or edema Pulses: 2+ and symmetric Skin: Skin color, texture, turgor normal. No rashes or lesions Neurologic: Grossly normal Psych: Pleasant  EKG: Declined  ASSESSMENT: 1. Atypical chest pain 2. Possible family history of coronary disease/arrhythmia 3. Dyslipidemia with high HDL 4. Inappropriate tachycardia-?  Exercise-induced arrhythmia  PLAN: 1.   Ms. Thalman is describing somewhat of an atypical chest pain.  There is some family history of coronary disease although I suspect she has few cardiovascular risk factors.  Her LDL has been quite elevated however HDL is actually quite high 2.  She exercises quite a bit and is very physically active.  She does not seem to have symptoms with activity therefore think her risk of obstructive coronary disease is low.  I am curious as to whether she may have developed any coronary disease based on her lipid profile.  Given her chest pain symptoms I think a CT coronary angiogram is an appropriate test to rule out coronary disease and also provide calcium scoring.  Of note her heart rate is typically in  the upper 40s therefore she will not likely require any beta-blockade for the study.  I wonder if she could also be having exercise-induced arrhythmias, she will need to consider something other than a plain heart rate monitor to see if this is the case.  She could consider perhaps an apple watch or cardia mobile device.  Thanks again for the kind referral.  We will follow up after the study.  Pixie Casino, MD, [facility], [place] Director of the [facility]  [facility] Diplomate of the American Board of Clinical Lipidology Attending Cardiologist  Direct Dial: [phone]  Fax: [phone]  Website:  [email or web address] Nicely Leul Narramore 09/16/2019, 1:56 PM

## 2019-09-16 NOTE — Patient Instructions (Addendum)
Medication Instructions:  NO CHANGES *If you need a refill on your cardiac medications before your next appointment, please call your pharmacy*  Lab Work: BMET to be completed 1 week prior to CT test If you have labs (blood work) drawn today and your tests are completely normal, you will receive your results only by: Marland Kitchen MyChart Message (if you have MyChart) OR . A paper copy in the mail If you have any lab test that is abnormal or we need to change your treatment, we will call you to review the results.  Testing/Procedures: Coronary CT with FFR to be completed at [facility]  Instructions below  Follow-Up: At [facility], you and your health needs are our priority.  As part of our continuing mission to provide you with exceptional heart care, we have created designated Provider Care Teams.  These Care Teams include your primary Cardiologist (physician) and Advanced Practice Providers (APPs -  Physician Assistants and Nurse Practitioners) who all work together to provide you with the care you need, when you need it.  Your next appointment:   After CT test  The format for your next appointment:   Either In Person or Virtual  Provider:   You may see Dr. Rennis Golden or one of the following Advanced Practice Providers on your designated Care Team:    Azalee Course, PA-C  Micah Flesher, [place] or   Judy Pimple, [place]   Other Instructions   Your cardiac CT will be scheduled at one of the below locations:   [facility] [address] [place], [place] [address] [phone]  If scheduled at [facility], please arrive at the [facility] main entrance of [facility] 30-45 minutes prior to test start time. Proceed to the [facility] Radiology Department (first floor) to check-in and test prep.  Please follow these instructions carefully (unless otherwise directed):  On the Night Before the Test: . Be sure to Drink plenty of water. . Do not consume any  caffeinated/decaffeinated beverages or chocolate 12 hours prior to your test. . Do not take any antihistamines 12 hours prior to your test.  On the Day of the Test: . Drink plenty of water. Do not drink any water within one hour of the test. . Do not eat any food 4 hours prior to the test. . You may take your regular medications prior to the test.  . FEMALES- please wear underwire-free bra if available      After the Test: . Drink plenty of water. . After receiving IV contrast, you may experience a mild flushed feeling. This is normal. . On occasion, you may experience a mild rash up to 24 hours after the test. This is not dangerous. If this occurs, you can take Benadryl 25 mg and increase your fluid intake. . If you experience trouble breathing, this can be serious. If it is severe call 911 IMMEDIATELY. If it is mild, please call our office.   Once we have confirmed authorization from your insurance company, we will call you to set up a date and time for your test.   For non-scheduling related questions, please contact the cardiac imaging nurse navigator should you have any questions/concerns: Rockwell Alexandria, RN Navigator Cardiac Imaging [facility] [phone] Office

## 2019-09-22 DIAGNOSIS — M79605 Pain in left leg: Secondary | ICD-10-CM | POA: Diagnosis not present

## 2019-09-22 DIAGNOSIS — R269 Unspecified abnormalities of gait and mobility: Secondary | ICD-10-CM | POA: Diagnosis not present

## 2019-10-07 DIAGNOSIS — M79605 Pain in left leg: Secondary | ICD-10-CM | POA: Diagnosis not present

## 2019-10-07 DIAGNOSIS — R269 Unspecified abnormalities of gait and mobility: Secondary | ICD-10-CM | POA: Diagnosis not present

## 2019-10-13 DIAGNOSIS — R269 Unspecified abnormalities of gait and mobility: Secondary | ICD-10-CM | POA: Diagnosis not present

## 2019-10-13 DIAGNOSIS — M79605 Pain in left leg: Secondary | ICD-10-CM | POA: Diagnosis not present

## 2019-10-20 ENCOUNTER — Telehealth: Payer: Self-pay | Admitting: Internal Medicine

## 2019-10-20 NOTE — Telephone Encounter (Signed)
RE: Cardiac ct Received: 2 days ago Message Contents  Etheleen Sia J; P [facility] Billing; Lindell Spar, RN  Cc: Bertram Gala  [facility],   I reached out to this patient with the information I received from the preservice center. She is still researching as to whether she wants this or not. She will be in touch.   Rhonda       Previous Messages   ----- Message -----  From: Berle Mull  Sent: 10/18/2019 10:44 AM EST  To: Bertram Gala, Lindell Spar, RN, *  Subject: Cardiac ct                    Good morning I have been trying to schedule this patient for their cardiac ct , but the patient will not schedule until they know what their out of pocket expense is, I have given her the pre-service center number, I have left messages on their voicemail myself for the patient to get a call back, and the patient is stating she has not received a call back. Is there anyway I can get some assistance in getting this patient a call back, so that we can proceed with her testing?   Florina Ou 09/22/2019 3:13 PM [employer] - AUTH # [id number] EXP: 03/19/2020  Royann Shivers 09/23/2019 10:13 AM LMOM to schedule cardiac ct/lw  Berle Mull 09/26/2019 9:38 AM spoke with pt she needs to call the preservice center to find out her cost before proceeding , she will call back  Berle Mull 10/06/2019 2:54 PM 28/18/21 252p tried to reach pt for cardiac ct no answer vm is full njm  Berle Mull 10/11/2019 12:19 PM 10/11/19 [id number] spoke with pt she has not heard from the preservice center on the out of pocket cost njm  Berle Mull 10/11/2019 12:21 PM 10/11/23 lm at the preservice center vm to call patient back njm  Berle Mull 10/18/2019 10:44 AM 10/18/19 [id number] called pt to schedule cardiac ct , pt still has not heard from the preservice center [facility]   Thanks  Beacher May

## 2019-10-25 ENCOUNTER — Ambulatory Visit: Payer: BC Managed Care – PPO | Admitting: Internal Medicine

## 2019-10-27 DIAGNOSIS — R269 Unspecified abnormalities of gait and mobility: Secondary | ICD-10-CM | POA: Diagnosis not present

## 2019-10-27 DIAGNOSIS — M79605 Pain in left leg: Secondary | ICD-10-CM | POA: Diagnosis not present

## 2019-10-31 NOTE — Telephone Encounter (Signed)
Elder Love, RN  Patient wants to cancel cardiac ct at this time , she will call back when she is ready to have it done [facility]

## 2019-11-08 ENCOUNTER — Encounter: Payer: Self-pay | Admitting: Internal Medicine

## 2019-11-10 DIAGNOSIS — M79605 Pain in left leg: Secondary | ICD-10-CM | POA: Diagnosis not present

## 2019-11-10 DIAGNOSIS — R269 Unspecified abnormalities of gait and mobility: Secondary | ICD-10-CM | POA: Diagnosis not present

## 2019-11-14 ENCOUNTER — Telehealth: Payer: Self-pay | Admitting: Internal Medicine

## 2019-11-14 NOTE — Telephone Encounter (Signed)
New message:    Patient calling some called her concering a bill I did not see a note.

## 2019-11-17 DIAGNOSIS — M79605 Pain in left leg: Secondary | ICD-10-CM | POA: Diagnosis not present

## 2019-11-17 DIAGNOSIS — R269 Unspecified abnormalities of gait and mobility: Secondary | ICD-10-CM | POA: Diagnosis not present

## 2019-12-05 DIAGNOSIS — R269 Unspecified abnormalities of gait and mobility: Secondary | ICD-10-CM | POA: Diagnosis not present

## 2019-12-05 DIAGNOSIS — M79605 Pain in left leg: Secondary | ICD-10-CM | POA: Diagnosis not present

## 2019-12-21 DIAGNOSIS — R269 Unspecified abnormalities of gait and mobility: Secondary | ICD-10-CM | POA: Diagnosis not present

## 2019-12-21 DIAGNOSIS — M79605 Pain in left leg: Secondary | ICD-10-CM | POA: Diagnosis not present

## 2020-01-05 DIAGNOSIS — Z113 Encounter for screening for infections with a predominantly sexual mode of transmission: Secondary | ICD-10-CM | POA: Diagnosis not present

## 2020-01-05 DIAGNOSIS — N898 Other specified noninflammatory disorders of vagina: Secondary | ICD-10-CM | POA: Diagnosis not present

## 2020-01-05 DIAGNOSIS — Z681 Body mass index (BMI) 19 or less, adult: Secondary | ICD-10-CM | POA: Diagnosis not present

## 2020-02-06 DIAGNOSIS — M9905 Segmental and somatic dysfunction of pelvic region: Secondary | ICD-10-CM | POA: Diagnosis not present

## 2020-02-06 DIAGNOSIS — M9903 Segmental and somatic dysfunction of lumbar region: Secondary | ICD-10-CM | POA: Diagnosis not present

## 2020-02-06 DIAGNOSIS — M5441 Lumbago with sciatica, right side: Secondary | ICD-10-CM | POA: Diagnosis not present

## 2020-02-06 DIAGNOSIS — M9902 Segmental and somatic dysfunction of thoracic region: Secondary | ICD-10-CM | POA: Diagnosis not present

## 2020-05-09 DIAGNOSIS — Z01419 Encounter for gynecological examination (general) (routine) without abnormal findings: Secondary | ICD-10-CM | POA: Diagnosis not present

## 2020-05-09 DIAGNOSIS — Z124 Encounter for screening for malignant neoplasm of cervix: Secondary | ICD-10-CM | POA: Diagnosis not present

## 2020-05-09 DIAGNOSIS — N870 Mild cervical dysplasia: Secondary | ICD-10-CM | POA: Diagnosis not present

## 2020-05-09 DIAGNOSIS — R87810 Cervical high risk human papillomavirus (HPV) DNA test positive: Secondary | ICD-10-CM | POA: Diagnosis not present

## 2020-05-09 LAB — HM PAP SMEAR: HM Pap smear: NEGATIVE

## 2020-05-09 LAB — RESULTS CONSOLE HPV: CHL HPV: POSITIVE

## 2020-05-23 DIAGNOSIS — R300 Dysuria: Secondary | ICD-10-CM | POA: Diagnosis not present

## 2020-05-25 ENCOUNTER — Telehealth: Payer: BC Managed Care – PPO | Admitting: Medical

## 2020-05-25 ENCOUNTER — Other Ambulatory Visit: Payer: Self-pay

## 2020-05-25 DIAGNOSIS — A549 Gonococcal infection, unspecified: Secondary | ICD-10-CM | POA: Diagnosis not present

## 2020-05-25 DIAGNOSIS — N390 Urinary tract infection, site not specified: Secondary | ICD-10-CM | POA: Diagnosis not present

## 2020-05-25 NOTE — Progress Notes (Signed)
error 

## 2020-05-29 DIAGNOSIS — F4320 Adjustment disorder, unspecified: Secondary | ICD-10-CM | POA: Diagnosis not present

## 2020-06-04 DIAGNOSIS — N72 Inflammatory disease of cervix uteri: Secondary | ICD-10-CM | POA: Diagnosis not present

## 2020-06-04 DIAGNOSIS — N871 Moderate cervical dysplasia: Secondary | ICD-10-CM | POA: Diagnosis not present

## 2020-06-04 DIAGNOSIS — B977 Papillomavirus as the cause of diseases classified elsewhere: Secondary | ICD-10-CM | POA: Diagnosis not present

## 2020-06-04 DIAGNOSIS — R87810 Cervical high risk human papillomavirus (HPV) DNA test positive: Secondary | ICD-10-CM | POA: Diagnosis not present

## 2020-06-06 DIAGNOSIS — Z23 Encounter for immunization: Secondary | ICD-10-CM | POA: Diagnosis not present

## 2020-06-11 DIAGNOSIS — F4320 Adjustment disorder, unspecified: Secondary | ICD-10-CM | POA: Diagnosis not present

## 2020-06-18 ENCOUNTER — Ambulatory Visit: Payer: BC Managed Care – PPO | Attending: Internal Medicine

## 2020-06-18 ENCOUNTER — Other Ambulatory Visit: Payer: Self-pay

## 2020-06-18 DIAGNOSIS — Z23 Encounter for immunization: Secondary | ICD-10-CM

## 2020-06-18 NOTE — Progress Notes (Signed)
   Covid-19 Vaccination Clinic  Name:  Kristina Newton    MRN: [id number] DOB: May 15, 1966  06/18/2020  Kristina Newton was observed post Covid-19 immunization for 15 minutes without incident. She was provided with Vaccine Information Sheet and instruction to access the V-Safe system.   Kristina Newton was instructed to call 911 with any severe reactions post vaccine: Marland Kitchen Difficulty breathing  . Swelling of face and throat  . A fast heartbeat  . A bad rash all over body  . Dizziness and weakness

## 2020-06-20 DIAGNOSIS — L578 Other skin changes due to chronic exposure to nonionizing radiation: Secondary | ICD-10-CM | POA: Diagnosis not present

## 2020-06-20 DIAGNOSIS — D2272 Melanocytic nevi of left lower limb, including hip: Secondary | ICD-10-CM | POA: Diagnosis not present

## 2020-06-20 DIAGNOSIS — L821 Other seborrheic keratosis: Secondary | ICD-10-CM | POA: Diagnosis not present

## 2020-06-20 DIAGNOSIS — D225 Melanocytic nevi of trunk: Secondary | ICD-10-CM | POA: Diagnosis not present

## 2020-06-20 DIAGNOSIS — L814 Other melanin hyperpigmentation: Secondary | ICD-10-CM | POA: Diagnosis not present

## 2020-06-21 DIAGNOSIS — N888 Other specified noninflammatory disorders of cervix uteri: Secondary | ICD-10-CM | POA: Diagnosis not present

## 2020-06-21 DIAGNOSIS — N871 Moderate cervical dysplasia: Secondary | ICD-10-CM | POA: Diagnosis not present

## 2020-06-21 DIAGNOSIS — N72 Inflammatory disease of cervix uteri: Secondary | ICD-10-CM | POA: Diagnosis not present

## 2020-06-22 ENCOUNTER — Encounter: Payer: Self-pay | Admitting: Medical

## 2020-06-28 DIAGNOSIS — F4320 Adjustment disorder, unspecified: Secondary | ICD-10-CM | POA: Diagnosis not present

## 2020-07-27 DIAGNOSIS — F4320 Adjustment disorder, unspecified: Secondary | ICD-10-CM | POA: Diagnosis not present

## 2020-08-06 ENCOUNTER — Other Ambulatory Visit: Payer: Self-pay | Admitting: Family Medicine

## 2020-08-06 DIAGNOSIS — Z1231 Encounter for screening mammogram for malignant neoplasm of breast: Secondary | ICD-10-CM

## 2020-08-21 ENCOUNTER — Encounter: Payer: Self-pay | Admitting: Medical

## 2020-08-23 DIAGNOSIS — F4320 Adjustment disorder, unspecified: Secondary | ICD-10-CM | POA: Diagnosis not present

## 2020-09-05 DIAGNOSIS — F4320 Adjustment disorder, unspecified: Secondary | ICD-10-CM | POA: Diagnosis not present

## 2020-09-11 DIAGNOSIS — M9905 Segmental and somatic dysfunction of pelvic region: Secondary | ICD-10-CM | POA: Diagnosis not present

## 2020-09-11 DIAGNOSIS — M9902 Segmental and somatic dysfunction of thoracic region: Secondary | ICD-10-CM | POA: Diagnosis not present

## 2020-09-11 DIAGNOSIS — M9903 Segmental and somatic dysfunction of lumbar region: Secondary | ICD-10-CM | POA: Diagnosis not present

## 2020-09-11 DIAGNOSIS — M5441 Lumbago with sciatica, right side: Secondary | ICD-10-CM | POA: Diagnosis not present

## 2020-09-13 ENCOUNTER — Ambulatory Visit: Payer: BC Managed Care – PPO

## 2020-09-18 DIAGNOSIS — F4320 Adjustment disorder, unspecified: Secondary | ICD-10-CM | POA: Diagnosis not present

## 2020-09-18 DIAGNOSIS — Z8616 Personal history of COVID-19: Secondary | ICD-10-CM

## 2020-09-18 HISTORY — DX: Personal history of COVID-19: Z86.16

## 2020-09-20 DIAGNOSIS — M9905 Segmental and somatic dysfunction of pelvic region: Secondary | ICD-10-CM | POA: Diagnosis not present

## 2020-09-20 DIAGNOSIS — M9903 Segmental and somatic dysfunction of lumbar region: Secondary | ICD-10-CM | POA: Diagnosis not present

## 2020-09-20 DIAGNOSIS — M9902 Segmental and somatic dysfunction of thoracic region: Secondary | ICD-10-CM | POA: Diagnosis not present

## 2020-09-20 DIAGNOSIS — M5441 Lumbago with sciatica, right side: Secondary | ICD-10-CM | POA: Diagnosis not present

## 2020-10-01 ENCOUNTER — Ambulatory Visit: Payer: BC Managed Care – PPO

## 2020-10-03 DIAGNOSIS — F4320 Adjustment disorder, unspecified: Secondary | ICD-10-CM | POA: Diagnosis not present

## 2020-10-04 DIAGNOSIS — M9903 Segmental and somatic dysfunction of lumbar region: Secondary | ICD-10-CM | POA: Diagnosis not present

## 2020-10-04 DIAGNOSIS — M9905 Segmental and somatic dysfunction of pelvic region: Secondary | ICD-10-CM | POA: Diagnosis not present

## 2020-10-04 DIAGNOSIS — M5441 Lumbago with sciatica, right side: Secondary | ICD-10-CM | POA: Diagnosis not present

## 2020-10-04 DIAGNOSIS — M9902 Segmental and somatic dysfunction of thoracic region: Secondary | ICD-10-CM | POA: Diagnosis not present

## 2020-10-18 ENCOUNTER — Encounter: Payer: Self-pay | Admitting: Podiatry

## 2020-10-18 ENCOUNTER — Ambulatory Visit (INDEPENDENT_AMBULATORY_CARE_PROVIDER_SITE_OTHER): Payer: BC Managed Care – PPO

## 2020-10-18 ENCOUNTER — Other Ambulatory Visit: Payer: Self-pay

## 2020-10-18 ENCOUNTER — Ambulatory Visit (INDEPENDENT_AMBULATORY_CARE_PROVIDER_SITE_OTHER): Payer: BC Managed Care – PPO | Admitting: Podiatry

## 2020-10-18 DIAGNOSIS — M21619 Bunion of unspecified foot: Secondary | ICD-10-CM | POA: Diagnosis not present

## 2020-10-18 DIAGNOSIS — M76822 Posterior tibial tendinitis, left leg: Secondary | ICD-10-CM

## 2020-10-18 DIAGNOSIS — M76821 Posterior tibial tendinitis, right leg: Secondary | ICD-10-CM

## 2020-10-18 DIAGNOSIS — M21611 Bunion of right foot: Secondary | ICD-10-CM

## 2020-10-18 DIAGNOSIS — T148XXA Other injury of unspecified body region, initial encounter: Secondary | ICD-10-CM | POA: Diagnosis not present

## 2020-10-18 DIAGNOSIS — M21612 Bunion of left foot: Secondary | ICD-10-CM | POA: Diagnosis not present

## 2020-10-18 NOTE — Patient Instructions (Signed)
Bunion A bunion (hallux valgus) is a bump that forms slowly on the inner side of the big toe joint. It occurs when the big toe turns toward the second toe. Bunions may be small at first, but they often get larger over time. They can make walking painful. What are the causes? This condition may be caused by:  Wearing narrow or pointed shoes that force the big toe to press against the other toes.  Abnormal foot development that causes the foot to roll inward.  Changes in the foot that are caused by certain diseases, such as rheumatoid arthritis or polio.  A foot injury. What increases the risk? The following factors may make you more likely to develop this condition:  Wearing shoes that squeeze the toes together.  Having certain diseases, such as: ? Rheumatoid arthritis. ? Polio. ? Cerebral palsy.  Having family members who have bunions.  Being born with abnormally shaped feet (a foot deformity), such as flat feet or low arches.  Doing activities that put a lot of pressure on the feet, such as ballet dancing. What are the signs or symptoms? The main symptom of this condition is a bump on your big toe that you can notice. Other symptoms may include:  Pain.  Redness and inflammation around your big toe.  Thick or hardened skin on your big toe or between your toes.  Stiffness or loss of motion in your big toe.  Trouble with walking.   How is this diagnosed? This condition may be diagnosed based on your symptoms, medical history, and activities. You may also have tests and imaging, such as:  X-rays. These allow your health care provider to check the position of the bones in your foot and look for damage to your joint. They also help your health care provider determine the severity of your bunion and the best way to treat it.  Joint aspiration. In this test, a sample of fluid is removed from the toe joint. This test may be done if you are in a lot of pain. It helps rule out  diseases that cause painful swelling of the joints, such as arthritis or gout. How is this treated? Treatment depends on the severity of your symptoms. The goal of treatment is to relieve symptoms and prevent your bunion from getting worse. Your health care provider may recommend:  Wearing shoes that have a wide toe box, or using bunion pads to cushion the affected area.  Taping your toes together to keep them in a normal position.  Placing a device inside your shoe (orthotic device) to help reduce pressure on your toe joint.  Taking medicine to ease pain and inflammation.  Putting ice or heat on the affected area.  Doing stretching exercises.  Surgery, for severe cases. Follow these instructions at home: Managing pain, stiffness, and swelling  If directed, put ice on the painful area. To do this: ? Put ice in a plastic bag. ? Place a towel between your skin and the bag. ? Leave the ice on for 20 minutes, 2-3 times a day. ? Remove the ice if your skin turns bright red. This is very important. If you cannot feel pain, heat, or cold, you have a greater risk of damage to the area.  If directed, apply heat to the affected area before you exercise. Use the heat source that your health care provider recommends, such as a moist heat pack or a heating pad. ? Place a towel between your skin and the   heat source. ? Leave the heat on for 20-30 minutes. ? Remove the heat if your skin turns bright red. This is especially important if you are unable to feel pain, heat, or cold. You have a greater risk of getting burned.      General instructions  Do exercises as told by your health care provider.  Support your toe joint with proper footwear, shoe padding, or taping as told by your health care provider.  Take over-the-counter and prescription medicines only as told by your health care provider.  Do not use any products that contain nicotine or tobacco, such as cigarettes, e-cigarettes, and  chewing tobacco. If you need help quitting, ask your health care provider.  Keep all follow-up visits. This is important. Contact a health care provider if:  Your symptoms get worse.  Your symptoms do not improve in 2 weeks. Get help right away if:  You have severe pain and trouble with walking. Summary  A bunion is a bump on the inner side of the big toe joint that forms when the big toe turns toward the second toe.  Bunions can make walking painful.  Treatment depends on the severity of your symptoms.  Support your toe joint with proper footwear, shoe padding, or taping as told by your health care provider. This information is not intended to replace advice given to you by your health care provider. Make sure you discuss any questions you have with your health care provider. Document Revised: 12/09/2019 Document Reviewed: 12/09/2019 Elsevier Patient Education  2021 Elsevier Inc.  

## 2020-10-19 NOTE — Progress Notes (Signed)
Subjective:   Patient ID: Kristina Newton, female   DOB: 55 y.o.   MRN: [id number]   HPI Patient is a very active female who presents with significant severe bunion deformity left over right and also pain in her left ankle which has been present for around 14 months and has worsened recently as patient has attempted to do half marathon triathlon and has not had success with being on her foot.  States that it is been quite swollen and she has done physical therapy in the past.  Patient does not smoke likes to be active   Review of Systems  All other systems reviewed and are negative.       Objective:  Physical Exam Vitals and nursing note reviewed.  Constitutional:      Appearance: She is well-developed and well-nourished.  Cardiovascular:     Pulses: Intact distal pulses.  Pulmonary:     Effort: Pulmonary effort is normal.  Musculoskeletal:        General: Normal range of motion.  Skin:    General: Skin is warm.  Neurological:     Mental Status: She is alert.     Neurovascular status found to be intact with patient noted to have significant collapse medial longitudinal arch left over right quite a bit of swelling in the medial ankle and when I tested posterior tibial tendon what appears to be significant reduction of muscle strength.  Patient does have good digital perfusion well oriented and is noted to have severe bunion deformity left over right with splaying of her forefoot during gait.  Tries to be active and this is not allowing her to do what she needs to do     Assessment:  Probability that this is a either tear stretch for some form of dysfunction posterior tibial tendon along with severe bunion deformity left over right     Plan:  H&P conditions reviewed at great length.  I do think there is a significant chance that there may be a tear of the posterior tibial tendon causing dysfunction of the arch and I have recommended MRI to understand better the pathology.   Patient is going to do a half triathlon the end of March in [place] but will not do the run and will be seen back after that.  Due to the swelling and pain I did dispense air fracture walker for her to start wearing to try to take pressure off the ankle and I also discussed the bunion deformity and the need for a Lapidus fusion.  Patient will be seen back to recheck when she returns from trip and we will start to make a plan for what she is going to require long-term  X-rays indicate that there is significant elevation of the intermetatarsal angle left over right and there is collapse medial longitudinal arch left over right with what appears to be calcaneal deformity

## 2020-10-30 DIAGNOSIS — F4320 Adjustment disorder, unspecified: Secondary | ICD-10-CM | POA: Diagnosis not present

## 2020-10-31 ENCOUNTER — Ambulatory Visit
Admission: RE | Admit: 2020-10-31 | Discharge: 2020-10-31 | Disposition: A | Discharge status: Home / Self Care | Payer: BC Managed Care – PPO | Source: Ambulatory Visit | Attending: Podiatry | Admitting: Podiatry

## 2020-10-31 DIAGNOSIS — S86112A Strain of other muscle(s) and tendon(s) of posterior muscle group at lower leg level, left leg, initial encounter: Secondary | ICD-10-CM | POA: Diagnosis not present

## 2020-10-31 DIAGNOSIS — T148XXA Other injury of unspecified body region, initial encounter: Secondary | ICD-10-CM

## 2020-10-31 DIAGNOSIS — M25472 Effusion, left ankle: Secondary | ICD-10-CM | POA: Diagnosis not present

## 2020-10-31 DIAGNOSIS — M65872 Other synovitis and tenosynovitis, left ankle and foot: Secondary | ICD-10-CM | POA: Diagnosis not present

## 2020-11-02 NOTE — Progress Notes (Signed)
Referring to you for post tendon repair. Have your assistant schedule her

## 2020-11-08 ENCOUNTER — Encounter (HOSPITAL_BASED_OUTPATIENT_CLINIC_OR_DEPARTMENT_OTHER): Payer: Self-pay | Admitting: Orthopaedic Surgery

## 2020-11-08 ENCOUNTER — Telehealth: Payer: Self-pay | Admitting: Podiatry

## 2020-11-08 ENCOUNTER — Other Ambulatory Visit: Payer: Self-pay

## 2020-11-08 DIAGNOSIS — M25561 Pain in right knee: Secondary | ICD-10-CM | POA: Diagnosis not present
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
## 2020-11-08 NOTE — Telephone Encounter (Signed)
Called patient to schedule appointment, she wanted to know the MRI results. She would like a phone call.

## 2020-11-10 ENCOUNTER — Inpatient Hospital Stay (HOSPITAL_COMMUNITY): Admission: RE | Admit: 2020-11-10 | Payer: BC Managed Care – PPO | Source: Ambulatory Visit

## 2020-11-12 ENCOUNTER — Telehealth: Payer: Self-pay | Admitting: Podiatry

## 2020-11-12 ENCOUNTER — Other Ambulatory Visit (HOSPITAL_COMMUNITY)
Admission: RE | Admit: 2020-11-12 | Discharge: 2020-11-12 | Disposition: A | Discharge status: Home / Self Care | Payer: BC Managed Care – PPO | Source: Ambulatory Visit | Attending: Orthopaedic Surgery | Admitting: Orthopaedic Surgery

## 2020-11-12 DIAGNOSIS — Z01812 Encounter for preprocedural laboratory examination: Secondary | ICD-10-CM | POA: Insufficient documentation

## 2020-11-12 DIAGNOSIS — Z888 Allergy status to other drugs, medicaments and biological substances status: Secondary | ICD-10-CM | POA: Diagnosis not present

## 2020-11-12 DIAGNOSIS — Z20822 Contact with and (suspected) exposure to covid-19: Secondary | ICD-10-CM | POA: Insufficient documentation

## 2020-11-12 DIAGNOSIS — S82141A Displaced bicondylar fracture of right tibia, initial encounter for closed fracture: Secondary | ICD-10-CM | POA: Diagnosis not present

## 2020-11-12 DIAGNOSIS — X58XXXA Exposure to other specified factors, initial encounter: Secondary | ICD-10-CM | POA: Diagnosis not present

## 2020-11-12 DIAGNOSIS — Z7982 Long term (current) use of aspirin: Secondary | ICD-10-CM | POA: Diagnosis not present

## 2020-11-12 LAB — SARS CORONAVIRUS 2 (TAT 6-24 HRS): SARS Coronavirus 2: NEGATIVE

## 2020-11-12 NOTE — Telephone Encounter (Signed)
Dr. Ardelle Anton is doing her surgery so probably should talk to her
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
## 2020-11-12 NOTE — Telephone Encounter (Signed)
Patient has requested virtual appointment for MRI results,Patient isn't sure who to be scheduled with so I added you both to message Please Advise

## 2020-11-14 ENCOUNTER — Other Ambulatory Visit: Payer: Self-pay

## 2020-11-14 ENCOUNTER — Ambulatory Visit (HOSPITAL_BASED_OUTPATIENT_CLINIC_OR_DEPARTMENT_OTHER): Payer: BC Managed Care – PPO | Admitting: Anesthesiology

## 2020-11-14 ENCOUNTER — Ambulatory Visit (HOSPITAL_BASED_OUTPATIENT_CLINIC_OR_DEPARTMENT_OTHER)
Admission: RE | Admit: 2020-11-14 | Discharge: 2020-11-14 | Disposition: A | Discharge status: Home / Self Care | Payer: BC Managed Care – PPO | Attending: Orthopaedic Surgery | Admitting: Orthopaedic Surgery

## 2020-11-14 ENCOUNTER — Encounter (HOSPITAL_BASED_OUTPATIENT_CLINIC_OR_DEPARTMENT_OTHER): Payer: Self-pay | Admitting: Orthopaedic Surgery

## 2020-11-14 ENCOUNTER — Telehealth: Payer: Self-pay | Admitting: Medical

## 2020-11-14 ENCOUNTER — Encounter (HOSPITAL_BASED_OUTPATIENT_CLINIC_OR_DEPARTMENT_OTHER)
Admission: RE | Disposition: A | Discharge status: Home / Self Care | Payer: Self-pay | Source: Home / Self Care | Attending: Orthopaedic Surgery

## 2020-11-14 ENCOUNTER — Ambulatory Visit (HOSPITAL_COMMUNITY): Payer: BC Managed Care – PPO

## 2020-11-14 DIAGNOSIS — X58XXXA Exposure to other specified factors, initial encounter: Secondary | ICD-10-CM | POA: Diagnosis not present

## 2020-11-14 DIAGNOSIS — S83281A Other tear of lateral meniscus, current injury, right knee, initial encounter: Secondary | ICD-10-CM | POA: Diagnosis not present

## 2020-11-14 DIAGNOSIS — Z7982 Long term (current) use of aspirin: Secondary | ICD-10-CM | POA: Diagnosis not present

## 2020-11-14 DIAGNOSIS — S82141A Displaced bicondylar fracture of right tibia, initial encounter for closed fracture: Secondary | ICD-10-CM | POA: Diagnosis not present

## 2020-11-14 DIAGNOSIS — Z419 Encounter for procedure for purposes other than remedying health state, unspecified: Secondary | ICD-10-CM

## 2020-11-14 DIAGNOSIS — R7989 Other specified abnormal findings of blood chemistry: Secondary | ICD-10-CM | POA: Diagnosis not present

## 2020-11-14 DIAGNOSIS — Z20822 Contact with and (suspected) exposure to covid-19: Secondary | ICD-10-CM | POA: Insufficient documentation

## 2020-11-14 DIAGNOSIS — T79A21A Traumatic compartment syndrome of right lower extremity, initial encounter: Secondary | ICD-10-CM | POA: Diagnosis not present

## 2020-11-14 DIAGNOSIS — E559 Vitamin D deficiency, unspecified: Secondary | ICD-10-CM | POA: Diagnosis not present

## 2020-11-14 DIAGNOSIS — S82101A Unspecified fracture of upper end of right tibia, initial encounter for closed fracture: Secondary | ICD-10-CM | POA: Diagnosis not present

## 2020-11-14 DIAGNOSIS — R739 Hyperglycemia, unspecified: Secondary | ICD-10-CM | POA: Diagnosis not present

## 2020-11-14 DIAGNOSIS — S82121D Displaced fracture of lateral condyle of right tibia, subsequent encounter for closed fracture with routine healing: Secondary | ICD-10-CM | POA: Diagnosis not present

## 2020-11-14 DIAGNOSIS — Z888 Allergy status to other drugs, medicaments and biological substances status: Secondary | ICD-10-CM | POA: Diagnosis not present

## 2020-11-14 HISTORY — PX: ORIF TIBIA PLATEAU: SHX2132

## 2020-11-14 SURGERY — OPEN REDUCTION INTERNAL FIXATION (ORIF) TIBIAL PLATEAU
Anesthesia: General | Site: Knee | Laterality: Right

## 2020-11-14 MED ORDER — ONDANSETRON HCL 4 MG/2ML IJ SOLN
INTRAMUSCULAR | Status: AC
  Filled 2020-11-14: qty 2

## 2020-11-14 MED ORDER — ONDANSETRON HCL 4 MG/2ML IJ SOLN
INTRAMUSCULAR | Status: DC | PRN
  Administered 2020-11-14: 4 mg via INTRAVENOUS

## 2020-11-14 MED ORDER — OXYCODONE HCL 5 MG PO TABS: 5.0000 mg | ORAL_TABLET | Freq: Once | ORAL | Status: DC | PRN

## 2020-11-14 MED ORDER — VANCOMYCIN HCL 1 G IV SOLR
INTRAVENOUS | Status: DC | PRN
  Administered 2020-11-14: 1000 mg

## 2020-11-14 MED ORDER — KETOROLAC TROMETHAMINE 30 MG/ML IJ SOLN
INTRAMUSCULAR | Status: AC
  Filled 2020-11-14: qty 1

## 2020-11-14 MED ORDER — FENTANYL CITRATE (PF) 100 MCG/2ML IJ SOLN
25.0000 ug | INTRAMUSCULAR | Status: DC | PRN
  Administered 2020-11-14 (×2): 50 ug via INTRAVENOUS

## 2020-11-14 MED ORDER — PROPOFOL 10 MG/ML IV BOLUS
INTRAVENOUS | Status: DC | PRN
  Administered 2020-11-14: 150 mg via INTRAVENOUS

## 2020-11-14 MED ORDER — RIVAROXABAN 10 MG PO TABS: 10.0000 mg | ORAL_TABLET | Freq: Every day | ORAL | 0 refills | Status: DC

## 2020-11-14 MED ORDER — ACETAMINOPHEN 500 MG PO TABS: 1000.0000 mg | ORAL_TABLET | Freq: Three times a day (TID) | ORAL | 0 refills | Status: AC

## 2020-11-14 MED ORDER — KETOROLAC TROMETHAMINE 30 MG/ML IJ SOLN
INTRAMUSCULAR | Status: DC | PRN
  Administered 2020-11-14: 30 mg via INTRAVENOUS

## 2020-11-14 MED ORDER — FENTANYL CITRATE (PF) 100 MCG/2ML IJ SOLN
INTRAMUSCULAR | Status: DC | PRN
  Administered 2020-11-14 (×5): 50 ug via INTRAVENOUS

## 2020-11-14 MED ORDER — LACTATED RINGERS IV SOLN: INTRAVENOUS | Status: DC

## 2020-11-14 MED ORDER — CELECOXIB 200 MG PO CAPS: 200.0000 mg | ORAL_CAPSULE | Freq: Two times a day (BID) | ORAL | 0 refills | Status: AC

## 2020-11-14 MED ORDER — OXYCODONE HCL 5 MG PO TABS: ORAL_TABLET | ORAL | 0 refills | Status: AC

## 2020-11-14 MED ORDER — HYDROMORPHONE HCL 1 MG/ML IJ SOLN
INTRAMUSCULAR | Status: DC | PRN
  Administered 2020-11-14: .5 mg via INTRAVENOUS

## 2020-11-14 MED ORDER — CEFAZOLIN SODIUM-DEXTROSE 2-4 GM/100ML-% IV SOLN
2.0000 g | INTRAVENOUS | Status: AC
  Administered 2020-11-14: 2 g via INTRAVENOUS

## 2020-11-14 MED ORDER — OXYCODONE HCL 5 MG/5ML PO SOLN: 5.0000 mg | Freq: Once | ORAL | Status: DC | PRN

## 2020-11-14 MED ORDER — FENTANYL CITRATE (PF) 100 MCG/2ML IJ SOLN
INTRAMUSCULAR | Status: AC
  Filled 2020-11-14: qty 2

## 2020-11-14 MED ORDER — METHOCARBAMOL 500 MG PO TABS: 500.0000 mg | ORAL_TABLET | Freq: Three times a day (TID) | ORAL | 0 refills | Status: DC | PRN

## 2020-11-14 MED ORDER — METHOCARBAMOL 500 MG PO TABS
500.0000 mg | ORAL_TABLET | Freq: Once | ORAL | Status: AC | PRN
  Administered 2020-11-14: 500 mg via ORAL
  Filled 2020-11-14: qty 1

## 2020-11-14 MED ORDER — LIDOCAINE 2% (20 MG/ML) 5 ML SYRINGE
INTRAMUSCULAR | Status: AC
  Filled 2020-11-14: qty 5

## 2020-11-14 MED ORDER — METHOCARBAMOL 1000 MG/10ML IJ SOLN: 500.0000 mg | Freq: Once | INTRAVENOUS | Status: DC

## 2020-11-14 MED ORDER — DEXAMETHASONE SODIUM PHOSPHATE 10 MG/ML IJ SOLN
INTRAMUSCULAR | Status: AC
  Filled 2020-11-14: qty 1

## 2020-11-14 MED ORDER — DEXAMETHASONE SODIUM PHOSPHATE 10 MG/ML IJ SOLN
INTRAMUSCULAR | Status: DC | PRN
  Administered 2020-11-14: 10 mg via INTRAVENOUS

## 2020-11-14 MED ORDER — DEXMEDETOMIDINE (PRECEDEX) IN NS 20 MCG/5ML (4 MCG/ML) IV SYRINGE
PREFILLED_SYRINGE | INTRAVENOUS | Status: DC | PRN
  Administered 2020-11-14: 8 ug via INTRAVENOUS
  Administered 2020-11-14: 12 ug via INTRAVENOUS
  Administered 2020-11-14 (×2): 8 ug via INTRAVENOUS
  Administered 2020-11-14: 4 ug via INTRAVENOUS

## 2020-11-14 MED ORDER — LIDOCAINE 2% (20 MG/ML) 5 ML SYRINGE
INTRAMUSCULAR | Status: DC | PRN
  Administered 2020-11-14: 60 mg via INTRAVENOUS

## 2020-11-14 MED ORDER — ONDANSETRON HCL 4 MG/2ML IJ SOLN: 4.0000 mg | Freq: Once | INTRAMUSCULAR | Status: DC | PRN

## 2020-11-14 MED ORDER — CEFAZOLIN SODIUM-DEXTROSE 2-4 GM/100ML-% IV SOLN
INTRAVENOUS | Status: AC
  Filled 2020-11-14: qty 100

## 2020-11-14 MED ORDER — MIDAZOLAM HCL 2 MG/2ML IJ SOLN
INTRAMUSCULAR | Status: AC
  Filled 2020-11-14: qty 2

## 2020-11-14 MED ORDER — GABAPENTIN 300 MG PO CAPS: 300.0000 mg | ORAL_CAPSULE | Freq: Three times a day (TID) | ORAL | 0 refills | Status: DC

## 2020-11-14 MED ORDER — KETOROLAC TROMETHAMINE 30 MG/ML IJ SOLN
30.0000 mg | Freq: Once | INTRAMUSCULAR | Status: AC
  Administered 2020-11-14: 30 mg via INTRAVENOUS

## 2020-11-14 MED ORDER — HYDROMORPHONE HCL 1 MG/ML IJ SOLN
INTRAMUSCULAR | Status: AC
  Filled 2020-11-14: qty 1

## 2020-11-14 MED ORDER — DEXMEDETOMIDINE (PRECEDEX) IN NS 20 MCG/5ML (4 MCG/ML) IV SYRINGE
PREFILLED_SYRINGE | INTRAVENOUS | Status: AC
  Filled 2020-11-14: qty 10

## 2020-11-14 MED ORDER — BUPIVACAINE HCL (PF) 0.25 % IJ SOLN
INTRAMUSCULAR | Status: AC
  Filled 2020-11-14: qty 30

## 2020-11-14 MED ORDER — ONDANSETRON HCL 4 MG PO TABS: 4.0000 mg | ORAL_TABLET | Freq: Three times a day (TID) | ORAL | 0 refills | Status: AC | PRN

## 2020-11-14 MED ORDER — MIDAZOLAM HCL 5 MG/5ML IJ SOLN
INTRAMUSCULAR | Status: DC | PRN
  Administered 2020-11-14: 2 mg via INTRAVENOUS

## 2020-11-14 MED ORDER — BUPIVACAINE HCL (PF) 0.5 % IJ SOLN
INTRAMUSCULAR | Status: DC | PRN
  Administered 2020-11-14: 30 mL

## 2020-11-14 SURGICAL SUPPLY — 100 items
BANDAGE ESMARK 6X9 LF (GAUZE/BANDAGES/DRESSINGS) IMPLANT
BIT DRILL 100X2.5XANTM LCK (BIT) ×1 IMPLANT
BIT DRILL 2.5X2.75 QC CALB (BIT) ×2 IMPLANT
BIT DRILL 2.9 CANN QC NONSTRL (BIT) ×2 IMPLANT
BIT DRILL CAL (BIT) ×1 IMPLANT
BIT DRL 100X2.5XANTM LCK (BIT) ×1
BLADE HEX COATED 2.75 (ELECTRODE) IMPLANT
BLADE SURG 10 STRL SS (BLADE) ×2 IMPLANT
BLADE SURG 15 STRL LF DISP TIS (BLADE) ×2 IMPLANT
BLADE SURG 15 STRL SS (BLADE) ×4
BNDG ELASTIC 4X5.8 VLCR STR LF (GAUZE/BANDAGES/DRESSINGS) ×2 IMPLANT
BNDG ELASTIC 6X5.8 VLCR STR LF (GAUZE/BANDAGES/DRESSINGS) ×2 IMPLANT
BNDG ESMARK 6X9 LF (GAUZE/BANDAGES/DRESSINGS)
BONE CANC CHIPS 20CC PCAN1/4 (Bone Implant) ×2 IMPLANT
CANISTER SUCT 1200ML W/VALVE (MISCELLANEOUS) IMPLANT
CHIPS CANC BONE 20CC PCAN1/4 (Bone Implant) ×1 IMPLANT
CHLORAPREP W/TINT 26 (MISCELLANEOUS) ×2 IMPLANT
CLSR STERI-STRIP ANTIMIC 1/2X4 (GAUZE/BANDAGES/DRESSINGS) ×4 IMPLANT
COOLER ICEMAN CLASSIC (MISCELLANEOUS) ×2 IMPLANT
COVER BACK TABLE 60X90IN (DRAPES) ×2 IMPLANT
COVER WAND RF STERILE (DRAPES) IMPLANT
CUBES CANC 20CC CANCUBE1/4 (Bone Implant) ×2 IMPLANT
CUFF TOURN SGL QUICK 34 (TOURNIQUET CUFF) ×2
CUFF TRNQT CYL 34X4.125X (TOURNIQUET CUFF) ×1 IMPLANT
DECANTER SPIKE VIAL GLASS SM (MISCELLANEOUS) IMPLANT
DRAPE C-ARM 42X72 X-RAY (DRAPES) ×2 IMPLANT
DRAPE C-ARMOR (DRAPES) ×2 IMPLANT
DRAPE EXTREMITY T 121X128X90 (DISPOSABLE) ×2 IMPLANT
DRAPE IMP U-DRAPE 54X76 (DRAPES) IMPLANT
DRAPE INCISE IOBAN 66X45 STRL (DRAPES) IMPLANT
DRAPE OEC MINIVIEW 54X84 (DRAPES) IMPLANT
DRAPE U-SHAPE 47X51 STRL (DRAPES) ×2 IMPLANT
DRILL BIT 2.5MM (BIT) ×2
DRILL BIT CAL (BIT) ×2
DRSG AQUACEL AG ADV 3.5X 6 (GAUZE/BANDAGES/DRESSINGS) IMPLANT
DRSG PAD ABDOMINAL 8X10 ST (GAUZE/BANDAGES/DRESSINGS) ×4 IMPLANT
ELECT REM PT RETURN 9FT ADLT (ELECTROSURGICAL) ×2
ELECTRODE REM PT RTRN 9FT ADLT (ELECTROSURGICAL) ×1 IMPLANT
FIBER TAPE 2MM (SUTURE) IMPLANT
GAUZE SPONGE 4X4 12PLY STRL (GAUZE/BANDAGES/DRESSINGS) ×2 IMPLANT
GLOVE SRG 8 PF TXTR STRL LF DI (GLOVE) ×1 IMPLANT
GLOVE SURG ENC MOIS LTX SZ6.5 (GLOVE) ×2 IMPLANT
GLOVE SURG LTX SZ6.5 (GLOVE) ×2 IMPLANT
GLOVE SURG LTX SZ8 (GLOVE) ×2 IMPLANT
GLOVE SURG UNDER POLY LF SZ6.5 (GLOVE) ×2 IMPLANT
GLOVE SURG UNDER POLY LF SZ7 (GLOVE) ×4 IMPLANT
GLOVE SURG UNDER POLY LF SZ8 (GLOVE) ×2
GOWN STRL REUS W/ TWL LRG LVL3 (GOWN DISPOSABLE) ×2 IMPLANT
GOWN STRL REUS W/TWL LRG LVL3 (GOWN DISPOSABLE) ×4
GOWN STRL REUS W/TWL XL LVL3 (GOWN DISPOSABLE) ×2 IMPLANT
IMMOBILIZER KNEE 22 UNIV (SOFTGOODS) ×2 IMPLANT
IMMOBILIZER KNEE 24 THIGH 36 (MISCELLANEOUS) IMPLANT
IMMOBILIZER KNEE 24 UNIV (MISCELLANEOUS)
K-WIRE ACE 1.6X6 (WIRE) ×8
KWIRE ACE 1.6X6 (WIRE) ×4 IMPLANT
MANIFOLD NEPTUNE II (INSTRUMENTS) IMPLANT
NDL SUT 6 .5 CRC .975X.05 MAYO (NEEDLE) ×1 IMPLANT
NEEDLE KEITH (NEEDLE) IMPLANT
NEEDLE MAYO TAPER (NEEDLE) ×2
NS IRRIG 1000ML POUR BTL (IV SOLUTION) ×2 IMPLANT
PACK ARTHROSCOPY DSU (CUSTOM PROCEDURE TRAY) ×2 IMPLANT
PACK BASIN DAY SURGERY FS (CUSTOM PROCEDURE TRAY) ×2 IMPLANT
PAD CAST 4YDX4 CTTN HI CHSV (CAST SUPPLIES) IMPLANT
PAD COLD SHLDR WRAP-ON (PAD) ×2 IMPLANT
PADDING CAST COTTON 4X4 STRL (CAST SUPPLIES)
PADDING CAST COTTON 6X4 STRL (CAST SUPPLIES) ×2 IMPLANT
PENCIL SMOKE EVACUATOR (MISCELLANEOUS) ×2 IMPLANT
PLATE LOCK 5H STD RT PROX TIB (Plate) ×2 IMPLANT
SCREW ACE CAN 4.0 38M (Screw) ×2 IMPLANT
SCREW ACE CAN 4.0 40M (Screw) ×2 IMPLANT
SCREW ACE CAN 4.0 42M (Screw) ×2 IMPLANT
SCREW CORTICAL 3.5MM 38MM (Screw) ×4 IMPLANT
SCREW CORTICAL 3.5MM 40MM (Screw) ×2 IMPLANT
SCREW LOCK 3.5X70 DIST TIB (Screw) ×2 IMPLANT
SCREW LOCK CORT STAR 3.5X56 (Screw) ×2 IMPLANT
SCREW LOCK CORT STAR 3.5X60 (Screw) ×6 IMPLANT
SCREW LOCK CORT STAR 3.5X65 (Screw) ×2 IMPLANT
SCREW LP 3.5X75MM (Screw) ×2 IMPLANT
SLEEVE SCD COMPRESS KNEE MED (STOCKING) ×2 IMPLANT
SPLINT FAST PLASTER 5X30 (CAST SUPPLIES)
SPLINT PLASTER CAST FAST 5X30 (CAST SUPPLIES) IMPLANT
SPONGE LAP 18X18 RF (DISPOSABLE) ×4 IMPLANT
SUCTION FRAZIER HANDLE 10FR (MISCELLANEOUS) ×2
SUCTION TUBE FRAZIER 10FR DISP (MISCELLANEOUS) ×1 IMPLANT
SUT 0 FIBERLOOP 38 BLUE TPR ND (SUTURE) ×6
SUT FIBERWIRE #2 38 T-5 BLUE (SUTURE)
SUT FIBERWIRE 2-0 18 17.9 3/8 (SUTURE)
SUT MNCRL AB 4-0 PS2 18 (SUTURE) ×4 IMPLANT
SUT VIC AB 0 CT1 27 (SUTURE) ×8
SUT VIC AB 0 CT1 27XBRD ANBCTR (SUTURE) ×4 IMPLANT
SUT VIC AB 1 CT1 27 (SUTURE)
SUT VIC AB 1 CT1 27XBRD ANBCTR (SUTURE) IMPLANT
SUT VIC AB 3-0 SH 27 (SUTURE) ×4
SUT VIC AB 3-0 SH 27X BRD (SUTURE) ×2 IMPLANT
SUTURE 0 FIBERLP 38 BLU TPR ND (SUTURE) ×3 IMPLANT
SUTURE FIBERWR #2 38 T-5 BLUE (SUTURE) IMPLANT
SUTURE FIBERWR 2-0 18 17.9 3/8 (SUTURE) IMPLANT
SYR BULB IRRIG 60ML STRL (SYRINGE) ×2 IMPLANT
TOWEL GREEN STERILE FF (TOWEL DISPOSABLE) ×6 IMPLANT
TUBE SUCTION HIGH CAP CLEAR NV (SUCTIONS) ×2 IMPLANT

## 2020-11-14 NOTE — Telephone Encounter (Signed)
Patient requested virtual for MRI results, Please Advise

## 2020-11-14 NOTE — Transfer of Care (Signed)
Immediate Anesthesia Transfer of Care Note  Patient: Kristina Newton  Procedure(s) Performed: OPEN REDUCTION INTERNAL FIXATION (ORIF) TIBIAL PLATEAU (Right Knee)  Patient Location: PACU  Anesthesia Type:General  Level of Consciousness: drowsy  Airway & Oxygen Therapy: Patient Spontanous Breathing and Patient connected to face mask oxygen  Post-op Assessment: Report given to RN and Post -op Vital signs reviewed and stable  Post vital signs: Reviewed and stable  Last Vitals:  Vitals Value Taken Time  BP    Temp    Pulse 74 11/14/20 1425  Resp 15 11/14/20 1425  SpO2 100 % 11/14/20 1425  Vitals shown include unvalidated device data.  Last Pain:  Vitals:   11/14/20 1043  TempSrc: Oral  PainSc: 10-Worst pain ever      Patients Stated Pain Goal: 5 (11/14/20 1043)  Complications: No complications documented.

## 2020-11-14 NOTE — Discharge Instructions (Signed)
Ramond Marrow MD, MPH Alfonse Alpers, PA-C [facility] [address] [phone] (tel)   [phone] (fax)   POST-OPERATIVE INSTRUCTIONS   WOUND CARE - You may remove the Operative Dressing on Post-Op Day #3 (72hrs after surgery).   - Alternatively if you would like you can leave dressing on until follow-up if within 7-8 days but keep it dry. - Leave steri-strips in place until they fall off on their own, usually 2 weeks postop. - An ACE wrap may be used to control swelling, do not wrap this too tight.  If the initial ACE wrap feels too tight you may loosen it. - There may be a small amount of fluid/bleeding leaking at the surgical site.    - This is normal; the knee is filled with fluid during the procedure and can leak for 24-48hrs after surgery.  - You may change/reinforce the bandage as needed.  - Use the Cryocuff or Ice as often as possible for the first 7 days, then as needed for pain relief. Always keep a towel, ACE wrap or other barrier between the cooling unit and your skin.  - You may shower on Post-Op Day #3. Gently pat the area dry. Do not soak the knee in water or submerge it.  - Do not go swimming in the pool or ocean until 4 weeks after surgery or when otherwise instructed.   - Keep dry incisions as dry as possible.   BRACE/AMBULATION - You will be placed in a brace post-operatively.  - Wear your brace at all times until follow-up. (including when you sleep) - You may remove for hygiene. - If your brace is too tight you may loosen it some -           Use crutches or a walker to help you ambulate -           Touch-down weight bearing: when you stand or walk, you may only touch your foot to the floor for balance -           Do NOT put any body weight on your leg!!   REGIONAL ANESTHESIA (NERVE BLOCKS) - The anesthesia team may have performed a nerve block for you if safe in the setting of your care.  This is a great tool used to minimize  pain.  Typically the block may start wearing off overnight.  This can be a challenging period but please utilize your as needed pain medications to try and manage this period and know it will be a brief transition as the nerve block wears completely   POST-OP MEDICATIONS - Multimodal approach to pain control - In general your pain will be controlled with a combination of substances.  Prescriptions unless otherwise discussed are electronically sent to your pharmacy.  This is a carefully made plan we use to minimize narcotic use.     - Celebrex - Anti-inflammatory medication taken on a scheduled basis   - Take 1 tablet twice a day - Acetaminophen - Non-narcotic pain medicine taken on a scheduled basis    - Take two 500 mg tablets (1,000 mg total) every 8 hours - Gabapentin - this is a non-narcotic medication to help with pain after surgery   - Take 1 tablet three times a day - Robaxin - this is a muscle relaxer, take as needed for muscle spasms - Oxycodone - This is a strong narcotic, to be used only on an "as needed" basis for pain. - Xarelto -  This medicine is used to minimize the risk of blood clots after surgery. - Zofran - take as needed for nausea  FOLLOW-UP   Please call the office to schedule a follow-up appointment for your incision check, 7-10 days post-operatively.  IF YOU HAVE ANY QUESTIONS, PLEASE FEEL FREE TO CALL OUR OFFICE.   HELPFUL INFORMATION  - If you had a block, it will wear off between 8-24 hrs postop typically.  This is period when your pain may go from nearly zero to the pain you would have had post-op without the block.  This is an abrupt transition but nothing dangerous is happening.  You may take an extra dose of narcotic when this happens.   Keep your leg elevated to decrease swelling, which will then in turn decrease your pain. I would elevate the foot of your bed by putting a couple of couch pillows between your mattress and box spring. I would not keep pillow  directly under your ankle.  - Do not sleep with a pillow behind your knee even if it is more comfortable as this may make it harder to get your knee fully straight long term.   There will be MORE swelling on days 1-3 than there is on the day of surgery.  This also is normal. The swelling will decrease with the anti-inflammatory medication, ice and keeping it elevated. The swelling will make it more difficult to bend your knee. As the swelling goes down your motion will become easier   You may develop swelling and bruising that extends from your knee down to your calf and perhaps even to your foot over the next week. Do not be alarmed. This too is normal, and it is due to gravity   There may be some numbness adjacent to the incision site. This may last for 6-12 months or longer in some patients and is expected.   You may return to sedentary work/school in the next couple of days when you feel up to it. You will need to keep your leg elevated as much as possible    You should wean off your narcotic medicines as soon as you are able.  Most patients will be off or using minimal narcotics before their first postop appointment.    We suggest you use the pain medication the first night prior to going to bed, in order to ease any pain when the anesthesia wears off. You should avoid taking pain medications on an empty stomach as it will make you nauseous.   Do not drink alcoholic beverages or take illicit drugs when taking pain medications.   It is against the law to drive while taking narcotics. You cannot drive if your Right leg is in brace locked in extension.   Pain medication may make you constipated.  Below are a few solutions to try in this order:  o Decrease the amount of pain medication if you aren't having pain.  o Drink lots of decaffeinated fluids.  o Drink prune juice and/or each dried prunes   o If the first 3 don't work start with additional solutions  o Take Colace - an  over-the-counter stool softener  o Take Senokot - an over-the-counter laxative  o Take Miralax - a stronger over-the-counter laxative   For more information including helpful videos and documents visit our website:   [email or web address]    No Ibuprofen/Motrin until 8:06 pm   Post Anesthesia Home Care Instructions  Activity: Get plenty of rest for the remainder of  the day. A responsible individual must stay with you for 24 hours following the procedure.  For the next 24 hours, DO NOT: -Drive a car -Advertising copywriter -Drink alcoholic beverages -Take any medication unless instructed by your physician -Make any legal decisions or sign important papers.  Meals: Start with liquid foods such as gelatin or soup. Progress to regular foods as tolerated. Avoid greasy, spicy, heavy foods. If nausea and/or vomiting occur, drink only clear liquids until the nausea and/or vomiting subsides. Call your physician if vomiting continues.  Special Instructions/Symptoms: Your throat may feel dry or sore from the anesthesia or the breathing tube placed in your throat during surgery. If this causes discomfort, gargle with warm salt water. The discomfort should disappear within 24 hours.  If you had a scopolamine patch placed behind your ear for the management of post- operative nausea and/or vomiting:  1. The medication in the patch is effective for 72 hours, after which it should be removed.  Wrap patch in a tissue and discard in the trash. Wash hands thoroughly with soap and water. 2. You may remove the patch earlier than 72 hours if you experience unpleasant side effects which may include dry mouth, dizziness or visual disturbances. 3. Avoid touching the patch. Wash your hands with soap and water after contact with the patch.

## 2020-11-14 NOTE — Anesthesia Preprocedure Evaluation (Signed)

## 2020-11-14 NOTE — Interval H&P Note (Signed)
History and Physical Interval Note:  11/14/2020 10:28 AM  Jama Flavors Kovack  has presented today for surgery, with the diagnosis of RIGHT TIBIAL PLATEAU FRACTURE.  The various methods of treatment have been discussed with the patient and family. After consideration of risks, benefits and other options for treatment, the patient has consented to  Procedure(s): OPEN REDUCTION INTERNAL FIXATION (ORIF) TIBIAL PLATEAU (Right) as a surgical intervention.  The patient's history has been reviewed, patient examined, no change in status, stable for surgery.  I have reviewed the patient's chart and labs.  Questions were answered to the patient's satisfaction.    I had a long discussion with the patient in the preoperative area prior to getting any anesthetic medicines.  She has a severe injury to the lateral plateau with extensive comminution and 3 to 4 cm of joint depression.  It is very worrisome for going on to long-term lateral compartment arthritis and she has a high risk for needing further surgeries including lysis of adhesion, arthroscopy and the possibility of total knee arthroplasty.  She understands all of these things.  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  She understands risk and benefits including but not limited to of infection, postoperative arthrosis and the possibility of compartment syndrome.  All questions were answered.  She elected to proceed.  We will plan for ORIF of the proximal tibia with possible meniscal repair versus partial meniscectomy and allograft bone grafting of the defect in the tibia.   Bjorn Pippin

## 2020-11-14 NOTE — Anesthesia Procedure Notes (Signed)
Procedure Name: LMA Insertion Date/Time: 11/14/2020 11:59 AM Performed by: Caren Macadam, CRNA Pre-anesthesia Checklist: Patient identified, Emergency Drugs available, Suction available and Patient being monitored Patient Re-evaluated:Patient Re-evaluated prior to induction Oxygen Delivery Method: Circle system utilized Preoxygenation: Pre-oxygenation with 100% oxygen Induction Type: IV induction Ventilation: Mask ventilation without difficulty LMA: LMA inserted LMA Size: 4.0 Number of attempts: 1 Placement Confirmation: positive ETCO2 and breath sounds checked- equal and bilateral Tube secured with: Tape Dental Injury: Teeth and Oropharynx as per pre-operative assessment

## 2020-11-14 NOTE — Anesthesia Postprocedure Evaluation (Signed)
Anesthesia Post Note  Patient: Delvina Mizzell Vogelsang  Procedure(s) Performed: OPEN REDUCTION INTERNAL FIXATION (ORIF) TIBIAL PLATEAU (Right Knee)     Patient location during evaluation: PACU Anesthesia Type: General Level of consciousness: awake and alert Pain management: pain level controlled Vital Signs Assessment: post-procedure vital signs reviewed and stable Respiratory status: spontaneous breathing, nonlabored ventilation, respiratory function stable and patient connected to nasal cannula oxygen Cardiovascular status: blood pressure returned to baseline and stable Postop Assessment: no apparent nausea or vomiting Anesthetic complications: no   No complications documented.  Last Vitals:  Vitals:   11/14/20 1600 11/14/20 1651  BP: (!) 147/83 (!) 150/71  Pulse: 73 76  Resp: 15 18  Temp:  37 C  SpO2: 98% 99%    Last Pain:  Vitals:   11/14/20 1651  TempSrc:   PainSc: 4                  Darrion Macaulay COKER

## 2020-11-14 NOTE — Telephone Encounter (Signed)
forwarding

## 2020-11-14 NOTE — Telephone Encounter (Signed)
Please call patient.  I am sorry to hear about her recent injury.  I hope she has a speedy recovery!  I also wanted to get some clarification.    I haven't seen her since 2020, and before that 2017.   I think she primarily sees gynecology for routine care, and chart suggests she sees a [place] [facility] or other provider for other acute things since I last saw her.   Thus, if she has no intentions to return here for routine care/preventaive care for example, lets remove me as PCP as I currently get all of her copies from other specialists and such.

## 2020-11-15 ENCOUNTER — Telehealth: Payer: Self-pay | Admitting: Podiatry

## 2020-11-15 ENCOUNTER — Ambulatory Visit: Payer: BC Managed Care – PPO

## 2020-11-15 NOTE — Telephone Encounter (Signed)
Ok thanks 

## 2020-11-15 NOTE — Telephone Encounter (Signed)
Regal wants to transition care over to you, patient interested in surgery- needed MRI first and has been completed

## 2020-11-15 NOTE — Op Note (Signed)
Orthopaedic Surgery Operative Note (CSN: [id number])  Kristina Newton  [birth year] Date of Surgery: 11/14/2020   Diagnoses:  Right split depressed highly comminuted tibial plateau fracture  Procedure: Right open reduction internal fixation lateral split depressed tibial plateau fracture Open repair of lateral meniscus Open reduction total fixation of tibial tuberosity fracture Anterior compartment fasciotomy   Operative Finding Successful completion of the planned procedure.  Patient's central lateral tibia was comminuted extensively with greater than 8 to 10 pieces of small comminution involving primarily the posterior aspect of the central lateral tibia.  The fracturing of the joint surface was essentially nonreconstructable in areas as far as getting internal fixation of each individual piece and instead we would left attempting to raft up the primary portion of the joint surface and suspend the intercalary fragments holding them up with screws as well as our bone graft.  The tuberosity was a separate fragment and we did create an osteotomy lateral to this in order to appropriately visualize the joint surface.  Were able to reduce and reconstitute the width of the tibia to avoid valgus as well as central depression of the plateau.  The bone grafted with cancellous cubes as well as chips.  Post-operative plan: The patient will be touchdown weightbearing for 6 weeks with progressive weightbearing afterwards.  We will keep her in a Bledsoe brace locked in extension at night for the first 4 weeks and unlocked during the day.  She will have a maximum of 90 degrees of flexion until the 6-week mark.  She will then begin weightbearing in her brace.  She can be out of her brace for sleep starting at 2 weeks but will be locked straight asleep until then.  The patient will be discharged home.  DVT prophylaxis Aspirin 81 mg twice daily for 6 weeks.   Pain control with PRN pain medication preferring oral  medicines.  Follow up plan will be scheduled in approximately 7 days for incision check and XR.  Post-Op Diagnosis: Same Surgeons:Primary: Bjorn Pippin, MD Assistants:Caroline McBane PA-C Location: [facility] OR ROOM 6 Anesthesia: General with local anesthesia Antibiotics: Ancef 2 g with local vancomycin powder 1 g at the surgical site Tourniquet time: 88 minutes Estimated Blood Loss: Minimal Complications: None Specimens: None Implants: Implant Name Type Inv. Item Serial No. Manufacturer Lot No. LRB No. Used Action  CUBES CANCELLOUS 20CC - [id number] Bone Implant CUBES CANCELLOUS 20CC [id number] [facility] [id number] Right 1 Implanted  BONE Variety [facility] CHIPS 20CC - [id number] Bone Implant BONE [facility] CHIPS 20CC [id number] [facility] [id number] Right 1 Implanted  PLATE LOCK 5H STD RT PROX TIB - [id number] Plate PLATE LOCK 5H STD RT PROX TIB  ZIMMER RECON(ORTH,TRAU,BIO,SG) ON SET Right 1 Implanted  SCREW LP 3.5X75MM - [id number] Screw SCREW LP 3.5X75MM  ZIMMER RECON(ORTH,TRAU,BIO,SG) ON SET Right 1 Implanted  SCREW LOCK CORT STAR 3.5X60 - [id number] Screw SCREW LOCK CORT STAR 3.5X60  ZIMMER RECON(ORTH,TRAU,BIO,SG) ON SET Right 3 Implanted  SCREW LOCK CORT STAR 3.5X56 - [id number] Screw SCREW LOCK CORT STAR 3.5X56  ZIMMER RECON(ORTH,TRAU,BIO,SG) ON SET Right 1 Implanted  SCREW LOCK CORT STAR 3.5X65 - [id number] Screw SCREW LOCK CORT STAR 3.5X65  ZIMMER RECON(ORTH,TRAU,BIO,SG) ON SET Right 1 Implanted  SCREW CORTICAL 3.5MM - [id number] Screw SCREW CORTICAL 3.5MM  ZIMMER RECON(ORTH,TRAU,BIO,SG) ON SET Right 1 Implanted  SCREW CORTICAL 3.5MM - [id number] Screw SCREW CORTICAL 3.5MM  ZIMMER RECON(ORTH,TRAU,BIO,SG) ON SET Right 2 Implanted  SCREW LOCK 3.5X70 DIST TIB - [id number] Screw SCREW  LOCK 3.5X70 DIST TIB  ZIMMER RECON(ORTH,TRAU,BIO,SG) ON SET Right 1 Implanted  SCREW ACE CAN 4.0 20M - [id number] Screw SCREW ACE CAN 4.0 20M  ZIMMER RECON(ORTH,TRAU,BIO,SG) ON SET Right 1  Implanted  SCREW ACE CAN 4.0 71M - [id number] Screw SCREW ACE CAN 4.0 71M  ZIMMER RECON(ORTH,TRAU,BIO,SG) ON SET Right 1 Implanted    Indications for Surgery:   Kristina Newton is a 55 y.o. female with fall while in [place] performing an [place] competition at which time she sustained the above injury.  She was placed in a long-leg splint and had a CT scan on cell phone only and was sent to me for further evaluation by 1 my partners.  Based on her preoperative imaging she had a significant injury and required surgery as well as on a preoperative exam.  Benefits and risks of operative and nonoperative management were discussed prior to surgery with patient/guardian(s) and informed consent form was completed.  Specific risks including infection, need for additional surgery, nonunion, malunion, valgus deformity, postoperative arthrosis amongst others   Procedure:   The patient was identified properly. Informed consent was obtained and the surgical site was marked. The patient was taken up to suite where general anesthesia was induced.  The patient was positioned supine on a regular bed.  The right tibia was prepped and draped in the usual sterile fashion.  Timeout was performed before the beginning of the case.  Tourniquet was used for the above duration.  We began with a longitudinal approach the lateral knee using Gertie safe zone.  With the skin sharply achieving hemostasis we progressed.  We identified the IT band which was split in line with its fibers to its insertion on Gertie's tubercle.  At that point were able to elevate the IT band and 1 longitudinal flap in line with the anterior compartment and performed a fasciotomy of the anterior compartment prophylactically.  At this point we were able to carefully subperiosteally elevate the anterior compartment musculature identify the lateral tibia and the fracture fragments.  There was a clear that one of the fragments extended medial to the  tibial tubercle but the tubercle was a separate fragment.  We able to open an osteotomy just lateral to the tubercle with an osteotome.  We then performed a submeniscal arthrotomy.  We are able to identify the meniscal tissue and noted that there was a bucket-handle meniscus tear that was intercalated into the fracture site and the lateral femoral condyle was delivered in the central lateral tibia.  We extricated the lateral meniscus and noted that it was fairly damaged but we thought it was important to attempt repair to avoid long-term issues with the knee.  We placed 6-8 figure-of-eight 0 FiberWire passes of suture and tied these performing in the appropriate open meniscal repair.  We then open the joint with a varus stress as well as using her osteotomy and are able to visualize the fracture site.  There is a split depressed fracture of the lateral plateau with significant comminution and the articular surface was impacted and comminuted 90 degrees to its original orientation.  We are able to use an osteotome as well as a elevator to bring some of this up to a more reasonable orientation.  We used provisional K wire fixation to hold some of the larger fragments however it was clear that the comminution was so extensive that it would be impossible to hold with individual K wires.  We thus felt that elevating the majority  of the bone and excepting that holding her elevation in place with bone graft would be the most appropriate way to fix the fracture.  We placed multiple pieces of cancellous cubes as well as cancellous chips to bone graft the area and then closed her osteotomy.  We used a large reduction forcep to close down the osteotomy and reconstitute the width of the tibial plateau.  We then used fluoroscopy to guide and select a Biomet Alps proximal tibial plate which appropriately grafted the articular surface well being placed in a position to stabilize the fracture.  We placed the plate on the lateral  surface of the tibia and use fluoroscopic guidance to provisionally fix it  Importantly our first screw proximally was used to set the width of the tibia taking care not to over under reduced using a bicortical compression screw.  Once we did this we filled proximal locking screws rafting up the articular surface using locking screws via guides as well as multidirectional screws.  We then were able to place 3 nonlocking screws distal to the fracture site and obtained appropriate bicortical purchase.  Final fluoroscopic images demonstrated a near anatomic reduction of the joint with reconstitution of the tibial with and if anything neutral to slight varus alignment taking into account the potential for settling.  We irrigated copiously.  Submeniscal arthrotomy was closed and tied to the plate with Vicryl and FiberWire suture.  We irrigated the wound copiously before placing local antibiotic as listed above.  We closed the incision in a multilayer fashion with absorbable suture.  Sterile dressing was placed.  Knee immobilizer was placed.  Patient was awoken taken to PACU in stable condition.  Alfonse Alpers, PA-C, present and scrubbed throughout the case, critical for completion in a timely fashion, and for retraction, instrumentation, closure.

## 2020-11-15 NOTE — Telephone Encounter (Signed)
I have called and spoken with her. She just had ORIF of her tibia yesterday and getting over that. She is going to call back when she is ready to schedule. We went over the surgery and post-op course. She is going to call back or send a MyChart message when she is ready.

## 2020-11-15 NOTE — Telephone Encounter (Signed)
Patient called inquiring about previous call regarding MRI results, and requested more information regarding the treatment plan for the tore tendon-left foot, Previously sent message to you and Regal regarding patient and Regal advised patient to speak with you regarding matter,  Please Advise

## 2020-11-15 NOTE — Telephone Encounter (Signed)
I will call her today. I have never seen this patient but I can call her with the results.

## 2020-11-16 ENCOUNTER — Encounter (HOSPITAL_BASED_OUTPATIENT_CLINIC_OR_DEPARTMENT_OTHER): Payer: Self-pay | Admitting: Orthopaedic Surgery

## 2020-11-20 DIAGNOSIS — F4320 Adjustment disorder, unspecified: Secondary | ICD-10-CM | POA: Diagnosis not present

## 2020-11-22 DIAGNOSIS — S82101D Unspecified fracture of upper end of right tibia, subsequent encounter for closed fracture with routine healing: Secondary | ICD-10-CM | POA: Diagnosis not present

## 2020-11-26 DIAGNOSIS — Z9181 History of falling: Secondary | ICD-10-CM | POA: Diagnosis not present

## 2020-11-26 DIAGNOSIS — Z7901 Long term (current) use of anticoagulants: Secondary | ICD-10-CM | POA: Diagnosis not present

## 2020-11-26 DIAGNOSIS — Z791 Long term (current) use of non-steroidal anti-inflammatories (NSAID): Secondary | ICD-10-CM | POA: Diagnosis not present

## 2020-11-26 DIAGNOSIS — S82291D Other fracture of shaft of right tibia, subsequent encounter for closed fracture with routine healing: Secondary | ICD-10-CM | POA: Diagnosis not present

## 2020-11-28 DIAGNOSIS — F4320 Adjustment disorder, unspecified: Secondary | ICD-10-CM | POA: Diagnosis not present

## 2020-11-30 DIAGNOSIS — Z7901 Long term (current) use of anticoagulants: Secondary | ICD-10-CM | POA: Diagnosis not present

## 2020-11-30 DIAGNOSIS — S82291D Other fracture of shaft of right tibia, subsequent encounter for closed fracture with routine healing: Secondary | ICD-10-CM | POA: Diagnosis not present

## 2020-11-30 DIAGNOSIS — Z791 Long term (current) use of non-steroidal anti-inflammatories (NSAID): Secondary | ICD-10-CM | POA: Diagnosis not present

## 2020-11-30 DIAGNOSIS — Z9181 History of falling: Secondary | ICD-10-CM | POA: Diagnosis not present

## 2020-12-03 DIAGNOSIS — S82291D Other fracture of shaft of right tibia, subsequent encounter for closed fracture with routine healing: Secondary | ICD-10-CM | POA: Diagnosis not present

## 2020-12-03 DIAGNOSIS — Z7901 Long term (current) use of anticoagulants: Secondary | ICD-10-CM | POA: Diagnosis not present

## 2020-12-03 DIAGNOSIS — Z791 Long term (current) use of non-steroidal anti-inflammatories (NSAID): Secondary | ICD-10-CM | POA: Diagnosis not present

## 2020-12-03 DIAGNOSIS — Z9181 History of falling: Secondary | ICD-10-CM | POA: Diagnosis not present

## 2020-12-04 DIAGNOSIS — F4320 Adjustment disorder, unspecified: Secondary | ICD-10-CM | POA: Diagnosis not present

## 2020-12-05 DIAGNOSIS — Z7901 Long term (current) use of anticoagulants: Secondary | ICD-10-CM | POA: Diagnosis not present

## 2020-12-05 DIAGNOSIS — Z9181 History of falling: Secondary | ICD-10-CM | POA: Diagnosis not present

## 2020-12-05 DIAGNOSIS — Z791 Long term (current) use of non-steroidal anti-inflammatories (NSAID): Secondary | ICD-10-CM | POA: Diagnosis not present

## 2020-12-05 DIAGNOSIS — S82291D Other fracture of shaft of right tibia, subsequent encounter for closed fracture with routine healing: Secondary | ICD-10-CM | POA: Diagnosis not present

## 2020-12-11 DIAGNOSIS — Z7901 Long term (current) use of anticoagulants: Secondary | ICD-10-CM | POA: Diagnosis not present

## 2020-12-11 DIAGNOSIS — Z9181 History of falling: Secondary | ICD-10-CM | POA: Diagnosis not present

## 2020-12-11 DIAGNOSIS — S82291D Other fracture of shaft of right tibia, subsequent encounter for closed fracture with routine healing: Secondary | ICD-10-CM | POA: Diagnosis not present

## 2020-12-11 DIAGNOSIS — Z791 Long term (current) use of non-steroidal anti-inflammatories (NSAID): Secondary | ICD-10-CM | POA: Diagnosis not present

## 2020-12-13 DIAGNOSIS — Z9181 History of falling: Secondary | ICD-10-CM | POA: Diagnosis not present

## 2020-12-13 DIAGNOSIS — Z7901 Long term (current) use of anticoagulants: Secondary | ICD-10-CM | POA: Diagnosis not present

## 2020-12-13 DIAGNOSIS — Z791 Long term (current) use of non-steroidal anti-inflammatories (NSAID): Secondary | ICD-10-CM | POA: Diagnosis not present

## 2020-12-13 DIAGNOSIS — S82291D Other fracture of shaft of right tibia, subsequent encounter for closed fracture with routine healing: Secondary | ICD-10-CM | POA: Diagnosis not present

## 2020-12-14 DIAGNOSIS — S82101D Unspecified fracture of upper end of right tibia, subsequent encounter for closed fracture with routine healing: Secondary | ICD-10-CM | POA: Diagnosis not present

## 2020-12-17 DIAGNOSIS — F4321 Adjustment disorder with depressed mood: Secondary | ICD-10-CM | POA: Diagnosis not present

## 2020-12-17 DIAGNOSIS — M6281 Muscle weakness (generalized): Secondary | ICD-10-CM | POA: Diagnosis not present

## 2020-12-17 DIAGNOSIS — M25561 Pain in right knee: Secondary | ICD-10-CM | POA: Diagnosis not present

## 2020-12-17 DIAGNOSIS — M25661 Stiffness of right knee, not elsewhere classified: Secondary | ICD-10-CM | POA: Diagnosis not present

## 2020-12-17 DIAGNOSIS — R262 Difficulty in walking, not elsewhere classified: Secondary | ICD-10-CM | POA: Diagnosis not present

## 2020-12-19 DIAGNOSIS — M25661 Stiffness of right knee, not elsewhere classified: Secondary | ICD-10-CM | POA: Diagnosis not present

## 2020-12-19 DIAGNOSIS — M25561 Pain in right knee: Secondary | ICD-10-CM | POA: Diagnosis not present

## 2020-12-19 DIAGNOSIS — M6281 Muscle weakness (generalized): Secondary | ICD-10-CM | POA: Diagnosis not present

## 2020-12-19 DIAGNOSIS — R262 Difficulty in walking, not elsewhere classified: Secondary | ICD-10-CM | POA: Diagnosis not present

## 2020-12-24 DIAGNOSIS — M25661 Stiffness of right knee, not elsewhere classified: Secondary | ICD-10-CM | POA: Diagnosis not present

## 2020-12-24 DIAGNOSIS — R262 Difficulty in walking, not elsewhere classified: Secondary | ICD-10-CM | POA: Diagnosis not present

## 2020-12-24 DIAGNOSIS — M6281 Muscle weakness (generalized): Secondary | ICD-10-CM | POA: Diagnosis not present

## 2020-12-24 DIAGNOSIS — M25561 Pain in right knee: Secondary | ICD-10-CM | POA: Diagnosis not present

## 2020-12-27 DIAGNOSIS — M25561 Pain in right knee: Secondary | ICD-10-CM | POA: Diagnosis not present

## 2020-12-27 DIAGNOSIS — R262 Difficulty in walking, not elsewhere classified: Secondary | ICD-10-CM | POA: Diagnosis not present

## 2020-12-27 DIAGNOSIS — M6281 Muscle weakness (generalized): Secondary | ICD-10-CM | POA: Diagnosis not present

## 2020-12-27 DIAGNOSIS — M25661 Stiffness of right knee, not elsewhere classified: Secondary | ICD-10-CM | POA: Diagnosis not present

## 2020-12-31 DIAGNOSIS — F4320 Adjustment disorder, unspecified: Secondary | ICD-10-CM | POA: Diagnosis not present

## 2021-01-01 DIAGNOSIS — R262 Difficulty in walking, not elsewhere classified: Secondary | ICD-10-CM | POA: Diagnosis not present

## 2021-01-01 DIAGNOSIS — M25661 Stiffness of right knee, not elsewhere classified: Secondary | ICD-10-CM | POA: Diagnosis not present

## 2021-01-01 DIAGNOSIS — M25561 Pain in right knee: Secondary | ICD-10-CM | POA: Diagnosis not present

## 2021-01-01 DIAGNOSIS — M6281 Muscle weakness (generalized): Secondary | ICD-10-CM | POA: Diagnosis not present

## 2021-01-03 DIAGNOSIS — M25661 Stiffness of right knee, not elsewhere classified: Secondary | ICD-10-CM | POA: Diagnosis not present

## 2021-01-03 DIAGNOSIS — M25561 Pain in right knee: Secondary | ICD-10-CM | POA: Diagnosis not present

## 2021-01-03 DIAGNOSIS — R262 Difficulty in walking, not elsewhere classified: Secondary | ICD-10-CM | POA: Diagnosis not present

## 2021-01-03 DIAGNOSIS — M6281 Muscle weakness (generalized): Secondary | ICD-10-CM | POA: Diagnosis not present

## 2021-01-15 DIAGNOSIS — M6281 Muscle weakness (generalized): Secondary | ICD-10-CM | POA: Diagnosis not present

## 2021-01-15 DIAGNOSIS — M25661 Stiffness of right knee, not elsewhere classified: Secondary | ICD-10-CM | POA: Diagnosis not present

## 2021-01-15 DIAGNOSIS — R262 Difficulty in walking, not elsewhere classified: Secondary | ICD-10-CM | POA: Diagnosis not present

## 2021-01-15 DIAGNOSIS — M25561 Pain in right knee: Secondary | ICD-10-CM | POA: Diagnosis not present

## 2021-01-17 DIAGNOSIS — M25561 Pain in right knee: Secondary | ICD-10-CM | POA: Diagnosis not present

## 2021-01-17 DIAGNOSIS — M2142 Flat foot [pes planus] (acquired), left foot: Secondary | ICD-10-CM | POA: Diagnosis not present

## 2021-01-17 DIAGNOSIS — M2012 Hallux valgus (acquired), left foot: Secondary | ICD-10-CM | POA: Diagnosis not present

## 2021-01-17 DIAGNOSIS — M25661 Stiffness of right knee, not elsewhere classified: Secondary | ICD-10-CM | POA: Diagnosis not present

## 2021-01-17 DIAGNOSIS — M6281 Muscle weakness (generalized): Secondary | ICD-10-CM | POA: Diagnosis not present

## 2021-01-17 DIAGNOSIS — M21612 Bunion of left foot: Secondary | ICD-10-CM | POA: Diagnosis not present

## 2021-01-17 DIAGNOSIS — R262 Difficulty in walking, not elsewhere classified: Secondary | ICD-10-CM | POA: Diagnosis not present

## 2021-01-17 DIAGNOSIS — M76822 Posterior tibial tendinitis, left leg: Secondary | ICD-10-CM | POA: Diagnosis not present

## 2021-01-21 ENCOUNTER — Ambulatory Visit
Admission: RE | Admit: 2021-01-21 | Discharge: 2021-01-21 | Disposition: A | Discharge status: Home / Self Care | Payer: BC Managed Care – PPO | Source: Ambulatory Visit | Attending: Family Medicine | Admitting: Family Medicine

## 2021-01-21 ENCOUNTER — Other Ambulatory Visit: Payer: Self-pay

## 2021-01-21 DIAGNOSIS — M6281 Muscle weakness (generalized): Secondary | ICD-10-CM | POA: Diagnosis not present

## 2021-01-21 DIAGNOSIS — Z1231 Encounter for screening mammogram for malignant neoplasm of breast: Secondary | ICD-10-CM | POA: Diagnosis not present

## 2021-01-21 DIAGNOSIS — R262 Difficulty in walking, not elsewhere classified: Secondary | ICD-10-CM | POA: Diagnosis not present

## 2021-01-21 DIAGNOSIS — M25561 Pain in right knee: Secondary | ICD-10-CM | POA: Diagnosis not present

## 2021-01-21 DIAGNOSIS — M25661 Stiffness of right knee, not elsewhere classified: Secondary | ICD-10-CM | POA: Diagnosis not present

## 2021-01-22 DIAGNOSIS — N871 Moderate cervical dysplasia: Secondary | ICD-10-CM | POA: Diagnosis not present

## 2021-01-22 DIAGNOSIS — Z681 Body mass index (BMI) 19 or less, adult: Secondary | ICD-10-CM | POA: Diagnosis not present

## 2021-01-23 ENCOUNTER — Encounter: Payer: Self-pay | Admitting: Medical

## 2021-01-23 DIAGNOSIS — R262 Difficulty in walking, not elsewhere classified: Secondary | ICD-10-CM | POA: Diagnosis not present

## 2021-01-23 DIAGNOSIS — M25661 Stiffness of right knee, not elsewhere classified: Secondary | ICD-10-CM | POA: Diagnosis not present

## 2021-01-23 DIAGNOSIS — M6281 Muscle weakness (generalized): Secondary | ICD-10-CM | POA: Diagnosis not present

## 2021-01-23 DIAGNOSIS — M25561 Pain in right knee: Secondary | ICD-10-CM | POA: Diagnosis not present

## 2021-01-29 DIAGNOSIS — M25561 Pain in right knee: Secondary | ICD-10-CM | POA: Diagnosis not present

## 2021-01-29 DIAGNOSIS — M6281 Muscle weakness (generalized): Secondary | ICD-10-CM | POA: Diagnosis not present

## 2021-01-29 DIAGNOSIS — R262 Difficulty in walking, not elsewhere classified: Secondary | ICD-10-CM | POA: Diagnosis not present

## 2021-01-29 DIAGNOSIS — M25661 Stiffness of right knee, not elsewhere classified: Secondary | ICD-10-CM | POA: Diagnosis not present

## 2021-01-31 DIAGNOSIS — M25561 Pain in right knee: Secondary | ICD-10-CM | POA: Diagnosis not present

## 2021-01-31 DIAGNOSIS — M25661 Stiffness of right knee, not elsewhere classified: Secondary | ICD-10-CM | POA: Diagnosis not present

## 2021-01-31 DIAGNOSIS — R262 Difficulty in walking, not elsewhere classified: Secondary | ICD-10-CM | POA: Diagnosis not present

## 2021-01-31 DIAGNOSIS — M6281 Muscle weakness (generalized): Secondary | ICD-10-CM | POA: Diagnosis not present

## 2021-02-01 DIAGNOSIS — R87810 Cervical high risk human papillomavirus (HPV) DNA test positive: Secondary | ICD-10-CM | POA: Diagnosis not present

## 2021-02-01 DIAGNOSIS — N871 Moderate cervical dysplasia: Secondary | ICD-10-CM | POA: Diagnosis not present

## 2021-02-01 LAB — HM PAP SMEAR

## 2021-02-01 LAB — RESULTS CONSOLE HPV: CHL HPV: POSITIVE

## 2021-02-12 DIAGNOSIS — M25661 Stiffness of right knee, not elsewhere classified: Secondary | ICD-10-CM | POA: Diagnosis not present

## 2021-02-12 DIAGNOSIS — M6281 Muscle weakness (generalized): Secondary | ICD-10-CM | POA: Diagnosis not present

## 2021-02-12 DIAGNOSIS — M25561 Pain in right knee: Secondary | ICD-10-CM | POA: Diagnosis not present

## 2021-02-12 DIAGNOSIS — F4320 Adjustment disorder, unspecified: Secondary | ICD-10-CM | POA: Diagnosis not present

## 2021-02-12 DIAGNOSIS — R262 Difficulty in walking, not elsewhere classified: Secondary | ICD-10-CM | POA: Diagnosis not present

## 2021-02-14 DIAGNOSIS — M6281 Muscle weakness (generalized): Secondary | ICD-10-CM | POA: Diagnosis not present

## 2021-02-14 DIAGNOSIS — M25661 Stiffness of right knee, not elsewhere classified: Secondary | ICD-10-CM | POA: Diagnosis not present

## 2021-02-14 DIAGNOSIS — M25561 Pain in right knee: Secondary | ICD-10-CM | POA: Diagnosis not present

## 2021-02-14 DIAGNOSIS — R262 Difficulty in walking, not elsewhere classified: Secondary | ICD-10-CM | POA: Diagnosis not present

## 2021-02-21 DIAGNOSIS — M25561 Pain in right knee: Secondary | ICD-10-CM | POA: Diagnosis not present

## 2021-02-21 DIAGNOSIS — R262 Difficulty in walking, not elsewhere classified: Secondary | ICD-10-CM | POA: Diagnosis not present

## 2021-02-21 DIAGNOSIS — M25661 Stiffness of right knee, not elsewhere classified: Secondary | ICD-10-CM | POA: Diagnosis not present

## 2021-02-21 DIAGNOSIS — M6281 Muscle weakness (generalized): Secondary | ICD-10-CM | POA: Diagnosis not present

## 2021-02-26 DIAGNOSIS — M25561 Pain in right knee: Secondary | ICD-10-CM | POA: Diagnosis not present

## 2021-02-26 DIAGNOSIS — M25661 Stiffness of right knee, not elsewhere classified: Secondary | ICD-10-CM | POA: Diagnosis not present

## 2021-02-26 DIAGNOSIS — M6281 Muscle weakness (generalized): Secondary | ICD-10-CM | POA: Diagnosis not present

## 2021-02-26 DIAGNOSIS — R262 Difficulty in walking, not elsewhere classified: Secondary | ICD-10-CM | POA: Diagnosis not present

## 2021-02-27 DIAGNOSIS — M6281 Muscle weakness (generalized): Secondary | ICD-10-CM | POA: Diagnosis not present

## 2021-02-27 DIAGNOSIS — R262 Difficulty in walking, not elsewhere classified: Secondary | ICD-10-CM | POA: Diagnosis not present

## 2021-02-27 DIAGNOSIS — M25561 Pain in right knee: Secondary | ICD-10-CM | POA: Diagnosis not present

## 2021-02-27 DIAGNOSIS — M25661 Stiffness of right knee, not elsewhere classified: Secondary | ICD-10-CM | POA: Diagnosis not present

## 2021-03-04 DIAGNOSIS — F4320 Adjustment disorder, unspecified: Secondary | ICD-10-CM | POA: Diagnosis not present

## 2021-03-05 DIAGNOSIS — R87810 Cervical high risk human papillomavirus (HPV) DNA test positive: Secondary | ICD-10-CM | POA: Diagnosis not present

## 2021-03-05 DIAGNOSIS — N871 Moderate cervical dysplasia: Secondary | ICD-10-CM | POA: Diagnosis not present

## 2021-03-05 DIAGNOSIS — Z681 Body mass index (BMI) 19 or less, adult: Secondary | ICD-10-CM | POA: Diagnosis not present

## 2021-03-05 DIAGNOSIS — R87610 Atypical squamous cells of undetermined significance on cytologic smear of cervix (ASC-US): Secondary | ICD-10-CM | POA: Diagnosis not present

## 2021-03-06 DIAGNOSIS — M25661 Stiffness of right knee, not elsewhere classified: Secondary | ICD-10-CM | POA: Diagnosis not present

## 2021-03-06 DIAGNOSIS — M6281 Muscle weakness (generalized): Secondary | ICD-10-CM | POA: Diagnosis not present

## 2021-03-06 DIAGNOSIS — M25561 Pain in right knee: Secondary | ICD-10-CM | POA: Diagnosis not present

## 2021-03-06 DIAGNOSIS — R262 Difficulty in walking, not elsewhere classified: Secondary | ICD-10-CM | POA: Diagnosis not present

## 2021-03-12 DIAGNOSIS — M25661 Stiffness of right knee, not elsewhere classified: Secondary | ICD-10-CM | POA: Diagnosis not present

## 2021-03-12 DIAGNOSIS — R262 Difficulty in walking, not elsewhere classified: Secondary | ICD-10-CM | POA: Diagnosis not present

## 2021-03-12 DIAGNOSIS — M6281 Muscle weakness (generalized): Secondary | ICD-10-CM | POA: Diagnosis not present

## 2021-03-12 DIAGNOSIS — M25561 Pain in right knee: Secondary | ICD-10-CM | POA: Diagnosis not present

## 2021-04-15 DIAGNOSIS — F4320 Adjustment disorder, unspecified: Secondary | ICD-10-CM | POA: Diagnosis not present

## 2021-04-16 ENCOUNTER — Telehealth: Payer: Self-pay | Admitting: Medical

## 2021-04-16 NOTE — Telephone Encounter (Signed)
Pt coming in on 9/7 for flu shot and wants TB blood test done also. She will need an order for the blood test

## 2021-04-17 ENCOUNTER — Encounter: Payer: Self-pay | Admitting: Internal Medicine

## 2021-04-17 NOTE — Telephone Encounter (Signed)
She is coming in December for CPE

## 2021-04-23 ENCOUNTER — Encounter (HOSPITAL_BASED_OUTPATIENT_CLINIC_OR_DEPARTMENT_OTHER): Payer: Self-pay | Admitting: Obstetrics

## 2021-04-23 ENCOUNTER — Other Ambulatory Visit: Payer: Self-pay

## 2021-04-23 NOTE — Progress Notes (Signed)
Spoke w/ via phone for pre-op interview--- pt Lab needs dos----  cbc             Lab results------ no COVID test -----patient states asymptomatic no test needed Arrive at ------- 1045 on 04-26-2021 NPO after MN NO Solid Food.  Clear liquids from MN until--- 0945 Med rec completed Medications to take morning of surgery ----- none Diabetic medication ----- n/a Patient instructed no nail polish to be worn day of surgery Patient instructed to bring photo id and insurance card day of surgery Patient aware to have Driver (ride ) / caregiver for 24 hours after surgery --sig other, richard stanley Patient Special Instructions ----- n/a Pre-Op special Istructions ----- n/a Patient verbalized understanding of instructions that were given at this phone interview. Patient denies shortness of breath, chest pain, fever, cough at this phone interview.

## 2021-04-24 ENCOUNTER — Other Ambulatory Visit (INDEPENDENT_AMBULATORY_CARE_PROVIDER_SITE_OTHER): Payer: BC Managed Care – PPO

## 2021-04-24 DIAGNOSIS — Z111 Encounter for screening for respiratory tuberculosis: Secondary | ICD-10-CM | POA: Diagnosis not present

## 2021-04-24 DIAGNOSIS — Z23 Encounter for immunization: Secondary | ICD-10-CM

## 2021-04-26 ENCOUNTER — Encounter (HOSPITAL_BASED_OUTPATIENT_CLINIC_OR_DEPARTMENT_OTHER): Payer: Self-pay | Admitting: Obstetrics

## 2021-04-26 ENCOUNTER — Ambulatory Visit (HOSPITAL_BASED_OUTPATIENT_CLINIC_OR_DEPARTMENT_OTHER)
Admission: RE | Admit: 2021-04-26 | Discharge: 2021-04-26 | Disposition: A | Discharge status: Home / Self Care | Payer: BC Managed Care – PPO | Attending: Obstetrics | Admitting: Obstetrics

## 2021-04-26 ENCOUNTER — Other Ambulatory Visit: Payer: Self-pay

## 2021-04-26 ENCOUNTER — Ambulatory Visit (HOSPITAL_BASED_OUTPATIENT_CLINIC_OR_DEPARTMENT_OTHER): Payer: BC Managed Care – PPO | Admitting: Anesthesiology

## 2021-04-26 ENCOUNTER — Encounter (HOSPITAL_BASED_OUTPATIENT_CLINIC_OR_DEPARTMENT_OTHER)
Admission: RE | Disposition: A | Discharge status: Home / Self Care | Payer: Self-pay | Source: Home / Self Care | Attending: Obstetrics

## 2021-04-26 DIAGNOSIS — Z8616 Personal history of COVID-19: Secondary | ICD-10-CM | POA: Diagnosis not present

## 2021-04-26 DIAGNOSIS — N72 Inflammatory disease of cervix uteri: Secondary | ICD-10-CM | POA: Diagnosis not present

## 2021-04-26 DIAGNOSIS — E559 Vitamin D deficiency, unspecified: Secondary | ICD-10-CM | POA: Diagnosis not present

## 2021-04-26 DIAGNOSIS — Z9889 Other specified postprocedural states: Secondary | ICD-10-CM

## 2021-04-26 DIAGNOSIS — N871 Moderate cervical dysplasia: Secondary | ICD-10-CM | POA: Diagnosis not present

## 2021-04-26 DIAGNOSIS — N870 Mild cervical dysplasia: Secondary | ICD-10-CM | POA: Insufficient documentation

## 2021-04-26 DIAGNOSIS — E7800 Pure hypercholesterolemia, unspecified: Secondary | ICD-10-CM | POA: Diagnosis not present

## 2021-04-26 DIAGNOSIS — M1990 Unspecified osteoarthritis, unspecified site: Secondary | ICD-10-CM | POA: Diagnosis not present

## 2021-04-26 HISTORY — DX: Other specified postprocedural states: Z98.890

## 2021-04-26 HISTORY — PX: CERVICAL CONIZATION W/BX: SHX1330

## 2021-04-26 HISTORY — DX: Dysplasia of cervix uteri, unspecified: N87.9

## 2021-04-26 HISTORY — DX: Presence of spectacles and contact lenses: Z97.3

## 2021-04-26 HISTORY — DX: Unspecified osteoarthritis, unspecified site: M19.90

## 2021-04-26 HISTORY — DX: Other specified postprocedural states: R11.2

## 2021-04-26 LAB — CBC
HCT: 43.1 % (ref 36.0–46.0)
Hemoglobin: 14.3 g/dL (ref 12.0–15.0)
MCH: 31 pg (ref 26.0–34.0)
MCHC: 33.2 g/dL (ref 30.0–36.0)
MCV: 93.5 fL (ref 80.0–100.0)
Platelets: 263 10*3/uL (ref 150–400)
RBC: 4.61 MIL/uL (ref 3.87–5.11)
RDW: 12.9 % (ref 11.5–15.5)
WBC: 5.2 10*3/uL (ref 4.0–10.5)
nRBC: 0 % (ref 0.0–0.2)

## 2021-04-26 LAB — QUANTIFERON-TB GOLD PLUS
QuantiFERON Mitogen Value: >10 IU/mL
QuantiFERON Nil Value: 0.02 IU/mL
QuantiFERON TB1 Ag Value: 0.02 IU/mL
QuantiFERON TB2 Ag Value: 0.02 IU/mL
QuantiFERON-TB Gold Plus: NEGATIVE

## 2021-04-26 SURGERY — CONE BIOPSY, CERVIX
Anesthesia: General

## 2021-04-26 MED ORDER — SCOPOLAMINE 1 MG/3DAYS TD PT72
MEDICATED_PATCH | TRANSDERMAL | Status: AC
  Filled 2021-04-26: qty 1

## 2021-04-26 MED ORDER — OXYCODONE HCL 5 MG PO TABS: 5.0000 mg | ORAL_TABLET | Freq: Once | ORAL | Status: DC | PRN

## 2021-04-26 MED ORDER — LACTATED RINGERS IV SOLN: INTRAVENOUS | Status: DC

## 2021-04-26 MED ORDER — PROMETHAZINE HCL 25 MG/ML IJ SOLN: 6.2500 mg | INTRAMUSCULAR | Status: DC | PRN

## 2021-04-26 MED ORDER — KETOROLAC TROMETHAMINE 30 MG/ML IJ SOLN
INTRAMUSCULAR | Status: DC | PRN
  Administered 2021-04-26: 30 mg via INTRAVENOUS

## 2021-04-26 MED ORDER — ONDANSETRON HCL 4 MG/2ML IJ SOLN
INTRAMUSCULAR | Status: DC | PRN
  Administered 2021-04-26: 4 mg via INTRAVENOUS

## 2021-04-26 MED ORDER — FENTANYL CITRATE (PF) 100 MCG/2ML IJ SOLN
INTRAMUSCULAR | Status: AC
  Filled 2021-04-26: qty 2

## 2021-04-26 MED ORDER — LIDOCAINE 2% (20 MG/ML) 5 ML SYRINGE
INTRAMUSCULAR | Status: AC
  Filled 2021-04-26: qty 5

## 2021-04-26 MED ORDER — ONDANSETRON HCL 4 MG/2ML IJ SOLN
INTRAMUSCULAR | Status: AC
  Filled 2021-04-26: qty 2

## 2021-04-26 MED ORDER — FENTANYL CITRATE (PF) 100 MCG/2ML IJ SOLN
INTRAMUSCULAR | Status: DC | PRN
  Administered 2021-04-26 (×4): 25 ug via INTRAVENOUS

## 2021-04-26 MED ORDER — IODINE STRONG (LUGOLS) 5 % PO SOLN
ORAL | Status: DC | PRN
  Administered 2021-04-26: 0.2 mL

## 2021-04-26 MED ORDER — FENTANYL CITRATE (PF) 100 MCG/2ML IJ SOLN: 25.0000 ug | INTRAMUSCULAR | Status: DC | PRN

## 2021-04-26 MED ORDER — PROPOFOL 10 MG/ML IV BOLUS
INTRAVENOUS | Status: AC
  Filled 2021-04-26: qty 20

## 2021-04-26 MED ORDER — MIDAZOLAM HCL 2 MG/2ML IJ SOLN
INTRAMUSCULAR | Status: AC
  Filled 2021-04-26: qty 2

## 2021-04-26 MED ORDER — DEXAMETHASONE SODIUM PHOSPHATE 10 MG/ML IJ SOLN
INTRAMUSCULAR | Status: DC | PRN
  Administered 2021-04-26: 6 mg via INTRAVENOUS

## 2021-04-26 MED ORDER — DEXAMETHASONE SODIUM PHOSPHATE 10 MG/ML IJ SOLN
INTRAMUSCULAR | Status: AC
  Filled 2021-04-26: qty 1

## 2021-04-26 MED ORDER — AMISULPRIDE (ANTIEMETIC) 5 MG/2ML IV SOLN: 10.0000 mg | Freq: Once | INTRAVENOUS | Status: DC | PRN

## 2021-04-26 MED ORDER — FERRIC SUBSULFATE SOLN
Status: DC | PRN
  Administered 2021-04-26: 1 via TOPICAL

## 2021-04-26 MED ORDER — LIDOCAINE 2% (20 MG/ML) 5 ML SYRINGE
INTRAMUSCULAR | Status: DC | PRN
  Administered 2021-04-26: 60 mg via INTRAVENOUS

## 2021-04-26 MED ORDER — OXYCODONE HCL 5 MG/5ML PO SOLN
5.0000 mg | Freq: Once | ORAL | Status: DC | PRN
Start: 2021-04-26 — End: 2021-04-26

## 2021-04-26 MED ORDER — POVIDONE-IODINE 10 % EX SWAB: 2.0000 "application " | Freq: Once | CUTANEOUS | Status: DC

## 2021-04-26 MED ORDER — SCOPOLAMINE 1 MG/3DAYS TD PT72
1.0000 | MEDICATED_PATCH | TRANSDERMAL | Status: DC
  Administered 2021-04-26: 1.5 mg via TRANSDERMAL

## 2021-04-26 MED ORDER — PROPOFOL 10 MG/ML IV BOLUS
INTRAVENOUS | Status: DC | PRN
  Administered 2021-04-26: 140 mg via INTRAVENOUS

## 2021-04-26 MED ORDER — MIDAZOLAM HCL 5 MG/5ML IJ SOLN
INTRAMUSCULAR | Status: DC | PRN
  Administered 2021-04-26: 2 mg via INTRAVENOUS

## 2021-04-26 MED ORDER — ACETAMINOPHEN 500 MG PO TABS
ORAL_TABLET | ORAL | Status: AC
  Filled 2021-04-26: qty 2

## 2021-04-26 MED ORDER — EPHEDRINE SULFATE-NACL 50-0.9 MG/10ML-% IV SOSY
PREFILLED_SYRINGE | INTRAVENOUS | Status: DC | PRN
  Administered 2021-04-26: 10 mg via INTRAVENOUS

## 2021-04-26 MED ORDER — ACETAMINOPHEN 500 MG PO TABS
1000.0000 mg | ORAL_TABLET | Freq: Once | ORAL | Status: AC
  Administered 2021-04-26: 1000 mg via ORAL

## 2021-04-26 MED ORDER — LIDOCAINE-EPINEPHRINE (PF) 1 %-1:200000 IJ SOLN
INTRAMUSCULAR | Status: DC | PRN
  Administered 2021-04-26: 10 mL

## 2021-04-26 SURGICAL SUPPLY — 19 items
APL SWBSTK 6 STRL LF DISP (MISCELLANEOUS) ×1
APPLICATOR COTTON TIP 6 STRL (MISCELLANEOUS) ×1 IMPLANT
APPLICATOR COTTON TIP 6IN STRL (MISCELLANEOUS) ×2
BLADE SURG 11 STRL SS (BLADE) ×2 IMPLANT
ELECT BALL LEEP 5MM RED (ELECTRODE) ×2 IMPLANT
GLOVE SURG ENC MOIS LTX SZ6 (GLOVE) ×2 IMPLANT
GLOVE SURG UNDER POLY LF SZ6.5 (GLOVE) ×2 IMPLANT
GLOVE SURG UNDER POLY LF SZ7 (GLOVE) ×2 IMPLANT
GOWN STRL REUS W/ TWL LRG LVL3 (GOWN DISPOSABLE) ×3 IMPLANT
GOWN STRL REUS W/TWL LRG LVL3 (GOWN DISPOSABLE) ×6
NS IRRIG 1000ML POUR BTL (IV SOLUTION) ×2 IMPLANT
PACK VAGINAL WOMENS (CUSTOM PROCEDURE TRAY) ×2 IMPLANT
PAD OB MATERNITY 4.3X12.25 (PERSONAL CARE ITEMS) ×2 IMPLANT
SCOPETTES 8  STERILE (MISCELLANEOUS) ×2
SCOPETTES 8 STERILE (MISCELLANEOUS) ×1 IMPLANT
SUT VIC AB 0 CT1 27 (SUTURE) ×8
SUT VIC AB 0 CT1 27XBRD ANBCTR (SUTURE) ×4 IMPLANT
TOWEL OR 17X26 10 PK STRL BLUE (TOWEL DISPOSABLE) ×2 IMPLANT
YANKAUER SUCT BULB TIP NO VENT (SUCTIONS) ×2 IMPLANT

## 2021-04-26 NOTE — H&P (Addendum)
55 y.o. G2P2 postmenopausal female presents for cold knife cone for persistent CIN 2 following LEEP. She has a history of HPV+ (16) pap starting in 2019.  Colposcopy was CIN 1 until 2021.  In September 2021 she had another cytology neg, HPV 16+ pap.  Colposcopy revealed CIN2 on biopsy and ECC.  We discussed LEEP with tophat vs CKC and elected for LEEP.  She underwent a LEEP on June 21, 2020,  LEEP specimen with CIN 2. Tophat also w CIN 2, but "unable to determine relation to margins" despite tag. ECC negative but unclear if margins negative from top hat.  Discussed repeat immediate repeat excisional procedure vs repeat pap/ ECC at 6 months.  She elected for expected management.  Repeat pap was ASCUS HPV+.  Colposcopy showed persistent CIN 2 at 12 o'clock with negative ECC.     Past Medical History:  Diagnosis Date   Arthritis    Cervical dysplasia    Fibrocystic breast disease    History of cervical dysplasia 06/2010   s/p LEEP ,  CIN 2   History of COVID-19 09/18/2020   per pt mild symptoms that resolved   PONV (postoperative nausea and vomiting)    Vitamin D deficiency 2020   Wears contact lenses    right eye only    Past Surgical History:  Procedure Laterality Date   ANTERIOR CRUCIATE LIGAMENT REPAIR Left 2018   AUGMENTATION MAMMAPLASTY Bilateral 06/2016   BREAST LUMPECTOMY W/ NEEDLE LOCALIZATION  03/2006   @[facility]    COLONOSCOPY  06/2015   normal, repeat 2026.  Dr. 2027   DIAGNOSTIC MAMMOGRAM  03/2010   normal   ORIF TIBIA PLATEAU Right 11/14/2020   Procedure: OPEN REDUCTION INTERNAL FIXATION (ORIF) TIBIAL PLATEAU;  Surgeon: 11/16/2020, MD;  Location:  SURGERY CENTER;  Service: Orthopedics;  Laterality: Right;    OB History  No obstetric history on file.    Social History   Socioeconomic History   Marital status: Divorced    Spouse name: Not on file   Number of children: Not on file   Years of education: Not on file   Highest education level: Not on  file  Occupational History   Not on file  Tobacco Use   Smoking status: Never   Smokeless tobacco: Never  Vaping Use   Vaping Use: Never used  Substance and Sexual Activity   Alcohol use: Yes    Comment: socially on weekends   Drug use: Never   Sexual activity: Yes    Birth control/protection: Post-menopausal  Other Topics Concern   Not on file  Social History Narrative   Aerobics, Bjorn Pippin training, triathlons as of 2014 ongoing; bad separation 2016, divorced 2017.  2 children, (Lydia, danced for 12 years/[place]ans, attends [employer]).   Son (Sam) not athletic, going to [place] at [employer].  Works in [employer] for Air[employer].     06/2019   Social Determinants of Health   Financial Resource Strain: Not on file  Food Insecurity: Not on file  Transportation Needs: Not on file  Physical Activity: Not on file  Stress: Not on file  Social Connections: Not on file  Intimate Partner Violence: Not on file   Benadryl itch stopping [diphenhydramine hcl]    Vitals:   04/26/21 1110  BP: 131/78  Pulse: (!) 55  Resp: 20  Temp: 98.3 F (36.8 C)  SpO2: 100%     General:  NAD Abdomen:  Soft Ex:  no edema   A/P  55 y.o. presents for cold knife cone for persistent CIN 2 following LEEP. Discussed in detail risk, benefits, alternatives to procedures.  She understands the risks to include infection, bleeding, damage to surrounding structures (including but not limited to vagina, cervix, bladder, rectum), identification of underlying cancer requiring additional procedures.  She understands and elects to proceed.  Consent signed  [facility] GEFFEL Chestine Spore

## 2021-04-26 NOTE — CV Procedure (Addendum)
Operative Note   Preoperative Diagnosis: Cervical intraepithelial neoplasia 2  Postioerative Diagnosis: Cervical intraepithelial neoplasia 2  Procedure: Cold knife Cone  Surgeon: Marlow Baars, MD    Anesthesia:   general   EBL:  5 mL    Specimens:  cone biopsy and post-cone endocervical curettage   Findings:  Small cervix s/p prior LEEP excision.  After application of Lugol's solution, cervix entirely stained with the exception of a small area at 12 o'clock adjacent to the external os  Description of procedure After consent was verified, the patient was taken to the operating room where general anesthesia was administered without difficulty. The patient was placed in the dorsal lithotomy position using Allen stirrups.  The patient was prepped and draped in the normal sterile fashion. Bladder was drained with in and out catheter. A weighted speculum and deavar retractor were placed in the patient's vagina. Lugols was applied on the cervix and it was completely stained with the exception of a small area at 12 o'clock adjacent to the external os.  The cervix was then injected circumferentially with 1% lidocaine with epinephrine. Stay sutures were placed at 3 and 9 o'clock with 0-vicryl, which wer tied down and held. The cervix was relatively small following prior LEEP, so additional sutures were placed on the face of the cervix at 12 and 6 o'clock to aid in definition of the anatomy.   A #11 blade scalpel was used to circumferentially excise the central portion of the cervix, with are taken to fully excised the area of poor Lugol's uptake at 12 o'clock.  The endocervical curettage was performed without difficulty. The base of the cone was cauterized with a roller ball.  Monsels placed on the cervix bed. Excellent hemostasis was noted. The stay sutures were cut and knots left in situ.  All instruments were removed.The patient tolerated the procedure well and was taken to the recovery room in stable  condition. Sponge, lap, and needle counts were correct.

## 2021-04-26 NOTE — Transfer of Care (Signed)
Immediate Anesthesia Transfer of Care Note  Patient: Kristina Newton  Procedure(s) Performed: CONIZATION CERVIX WITH BIOPSY  Patient Location: PACU  Anesthesia Type:General  Level of Consciousness: awake, alert  and oriented  Airway & Oxygen Therapy: Patient Spontanous Breathing and Patient connected to face mask oxygen  Post-op Assessment: Report given to RN and Post -op Vital signs reviewed and stable  Post vital signs: Reviewed and stable  Last Vitals:  Vitals Value Taken Time  BP 118/85 04/26/21 1335  Temp 36.4 C 04/26/21 1335  Pulse 57 04/26/21 1339  Resp 18 04/26/21 1339  SpO2 100 % 04/26/21 1339  Vitals shown include unvalidated device data.  Last Pain:  Vitals:   04/26/21 1110  TempSrc: Oral  PainSc: 0-No pain      Patients Stated Pain Goal: 3 (04/26/21 1110)  Complications: No notable events documented.

## 2021-04-26 NOTE — Anesthesia Procedure Notes (Signed)
Procedure Name: LMA Insertion Date/Time: 04/26/2021 12:41 PM Performed by: Pearson Grippe, CRNA Pre-anesthesia Checklist: Patient identified, Emergency Drugs available, Suction available and Patient being monitored Patient Re-evaluated:Patient Re-evaluated prior to induction Oxygen Delivery Method: Circle system utilized Preoxygenation: Pre-oxygenation with 100% oxygen Induction Type: IV induction Ventilation: Mask ventilation without difficulty LMA: LMA inserted LMA Size: 4.0 Number of attempts: 1 Airway Equipment and Method: Bite block Placement Confirmation: positive ETCO2 Tube secured with: Tape Dental Injury: Teeth and Oropharynx as per pre-operative assessment

## 2021-04-26 NOTE — Discharge Instructions (Addendum)
  Post Anesthesia Home Care Instructions  Activity: Get plenty of rest for the remainder of the day. A responsible individual must stay with you for 24 hours following the procedure.  For the next 24 hours, DO NOT: -Drive a car -Advertising copywriter -Drink alcoholic beverages -Take any medication unless instructed by your physician -Make any legal decisions or sign important papers.  Meals: Start with liquid foods such as gelatin or soup. Progress to regular foods as tolerated. Avoid greasy, spicy, heavy foods. If nausea and/or vomiting occur, drink only clear liquids until the nausea and/or vomiting subsides. Call your physician if vomiting continues.  Special Instructions/Symptoms: Your throat may feel dry or sore from the anesthesia or the breathing tube placed in your throat during surgery. If this causes discomfort, gargle with warm salt water. The discomfort should disappear within 24 hours.  If you had a scopolamine patch placed behind your ear for the management of post- operative nausea and/or vomiting:  1. The medication in the patch is effective for 72 hours, after which it should be removed.  Wrap patch in a tissue and discard in the trash. Wash hands thoroughly with soap and water. 2. You may remove the patch earlier than 72 hours if you experience unpleasant side effects which may include dry mouth, dizziness or visual disturbances. 3. Avoid touching the patch. Wash your hands with soap and water after contact with the patch.   No ibuprofen, advil, motrin, aleve, or naproxen until after 7:30 pm this evening.

## 2021-04-26 NOTE — Anesthesia Postprocedure Evaluation (Signed)
Anesthesia Post Note  Patient: Kristina Newton  Procedure(s) Performed: CONIZATION CERVIX WITH BIOPSY     Patient location during evaluation: PACU Anesthesia Type: General Level of consciousness: awake Pain management: pain level controlled Vital Signs Assessment: post-procedure vital signs reviewed and stable Respiratory status: spontaneous breathing and respiratory function stable Cardiovascular status: stable Postop Assessment: no apparent nausea or vomiting Anesthetic complications: no   No notable events documented.  Last Vitals:  Vitals:   04/26/21 1345 04/26/21 1400  BP: 119/67 129/71  Pulse: (!) 56 (!) 53  Resp: 20 (!) 24  Temp:    SpO2: 100% 100%    Last Pain:  Vitals:   04/26/21 1335  TempSrc:   PainSc: 0-No pain                 Mellody Dance
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
## 2021-04-29 ENCOUNTER — Encounter (HOSPITAL_BASED_OUTPATIENT_CLINIC_OR_DEPARTMENT_OTHER): Payer: Self-pay | Admitting: Obstetrics

## 2021-04-30 LAB — SURGICAL PATHOLOGY

## 2021-05-03 DIAGNOSIS — N390 Urinary tract infection, site not specified: Secondary | ICD-10-CM | POA: Diagnosis not present

## 2021-05-15 DIAGNOSIS — F4320 Adjustment disorder, unspecified: Secondary | ICD-10-CM | POA: Diagnosis not present

## 2021-05-16 NOTE — Telephone Encounter (Signed)
error 

## 2021-05-28 DIAGNOSIS — L821 Other seborrheic keratosis: Secondary | ICD-10-CM | POA: Diagnosis not present

## 2021-05-28 DIAGNOSIS — L578 Other skin changes due to chronic exposure to nonionizing radiation: Secondary | ICD-10-CM | POA: Diagnosis not present

## 2021-05-28 DIAGNOSIS — D225 Melanocytic nevi of trunk: Secondary | ICD-10-CM | POA: Diagnosis not present

## 2021-05-28 DIAGNOSIS — L814 Other melanin hyperpigmentation: Secondary | ICD-10-CM | POA: Diagnosis not present

## 2021-05-28 DIAGNOSIS — L820 Inflamed seborrheic keratosis: Secondary | ICD-10-CM | POA: Diagnosis not present

## 2021-06-17 DIAGNOSIS — F4320 Adjustment disorder, unspecified: Secondary | ICD-10-CM | POA: Diagnosis not present

## 2021-06-25 DIAGNOSIS — Z791 Long term (current) use of non-steroidal anti-inflammatories (NSAID): Secondary | ICD-10-CM | POA: Diagnosis not present

## 2021-06-25 DIAGNOSIS — M2142 Flat foot [pes planus] (acquired), left foot: Secondary | ICD-10-CM | POA: Diagnosis not present

## 2021-06-25 DIAGNOSIS — M2012 Hallux valgus (acquired), left foot: Secondary | ICD-10-CM | POA: Diagnosis not present

## 2021-06-25 DIAGNOSIS — Z79899 Other long term (current) drug therapy: Secondary | ICD-10-CM | POA: Diagnosis not present

## 2021-06-25 DIAGNOSIS — M76822 Posterior tibial tendinitis, left leg: Secondary | ICD-10-CM | POA: Diagnosis not present

## 2021-06-25 DIAGNOSIS — G8918 Other acute postprocedural pain: Secondary | ICD-10-CM | POA: Diagnosis not present

## 2021-06-25 DIAGNOSIS — K219 Gastro-esophageal reflux disease without esophagitis: Secondary | ICD-10-CM | POA: Diagnosis not present

## 2021-06-25 DIAGNOSIS — M21612 Bunion of left foot: Secondary | ICD-10-CM | POA: Diagnosis not present

## 2021-06-25 DIAGNOSIS — I10 Essential (primary) hypertension: Secondary | ICD-10-CM | POA: Diagnosis not present

## 2021-06-28 DIAGNOSIS — F4320 Adjustment disorder, unspecified: Secondary | ICD-10-CM | POA: Diagnosis not present

## 2021-07-03 DIAGNOSIS — F4320 Adjustment disorder, unspecified: Secondary | ICD-10-CM | POA: Diagnosis not present

## 2021-07-04 DIAGNOSIS — M21612 Bunion of left foot: Secondary | ICD-10-CM | POA: Diagnosis not present

## 2021-07-04 DIAGNOSIS — M76822 Posterior tibial tendinitis, left leg: Secondary | ICD-10-CM | POA: Diagnosis not present

## 2021-07-04 DIAGNOSIS — M2012 Hallux valgus (acquired), left foot: Secondary | ICD-10-CM | POA: Diagnosis not present

## 2021-07-04 DIAGNOSIS — M2142 Flat foot [pes planus] (acquired), left foot: Secondary | ICD-10-CM | POA: Diagnosis not present

## 2021-07-15 DIAGNOSIS — F4320 Adjustment disorder, unspecified: Secondary | ICD-10-CM | POA: Diagnosis not present

## 2021-07-29 DIAGNOSIS — Z79899 Other long term (current) drug therapy: Secondary | ICD-10-CM | POA: Diagnosis not present

## 2021-07-29 DIAGNOSIS — M2142 Flat foot [pes planus] (acquired), left foot: Secondary | ICD-10-CM | POA: Diagnosis not present

## 2021-07-29 DIAGNOSIS — M76822 Posterior tibial tendinitis, left leg: Secondary | ICD-10-CM | POA: Diagnosis not present

## 2021-07-29 DIAGNOSIS — M2012 Hallux valgus (acquired), left foot: Secondary | ICD-10-CM | POA: Diagnosis not present

## 2021-07-30 DIAGNOSIS — F4320 Adjustment disorder, unspecified: Secondary | ICD-10-CM | POA: Diagnosis not present

## 2021-08-06 ENCOUNTER — Ambulatory Visit (INDEPENDENT_AMBULATORY_CARE_PROVIDER_SITE_OTHER): Payer: BC Managed Care – PPO | Admitting: Medical

## 2021-08-06 ENCOUNTER — Other Ambulatory Visit: Payer: Self-pay

## 2021-08-06 VITALS — BP 110/70 | HR 72 | Ht 64.0 in | Wt 118.0 lb

## 2021-08-06 DIAGNOSIS — D518 Other vitamin B12 deficiency anemias: Secondary | ICD-10-CM | POA: Diagnosis not present

## 2021-08-06 DIAGNOSIS — Z8249 Family history of ischemic heart disease and other diseases of the circulatory system: Secondary | ICD-10-CM

## 2021-08-06 DIAGNOSIS — Z7185 Encounter for immunization safety counseling: Secondary | ICD-10-CM

## 2021-08-06 DIAGNOSIS — Z23 Encounter for immunization: Secondary | ICD-10-CM

## 2021-08-06 DIAGNOSIS — E569 Vitamin deficiency, unspecified: Secondary | ICD-10-CM

## 2021-08-06 DIAGNOSIS — M2550 Pain in unspecified joint: Secondary | ICD-10-CM

## 2021-08-06 DIAGNOSIS — R5383 Other fatigue: Secondary | ICD-10-CM

## 2021-08-06 DIAGNOSIS — Z13220 Encounter for screening for lipoid disorders: Secondary | ICD-10-CM | POA: Diagnosis not present

## 2021-08-06 DIAGNOSIS — Z139 Encounter for screening, unspecified: Secondary | ICD-10-CM

## 2021-08-06 DIAGNOSIS — Z136 Encounter for screening for cardiovascular disorders: Secondary | ICD-10-CM | POA: Insufficient documentation

## 2021-08-06 DIAGNOSIS — Z0000 Encounter for general adult medical examination without abnormal findings: Secondary | ICD-10-CM | POA: Diagnosis not present

## 2021-08-06 DIAGNOSIS — R87619 Unspecified abnormal cytological findings in specimens from cervix uteri: Secondary | ICD-10-CM

## 2021-08-06 DIAGNOSIS — Z780 Asymptomatic menopausal state: Secondary | ICD-10-CM

## 2021-08-06 DIAGNOSIS — E559 Vitamin D deficiency, unspecified: Secondary | ICD-10-CM
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
## 2021-08-06 NOTE — Addendum Note (Signed)
Addended by: Herminio Commons A on: 08/06/2021 09:18 AM   Modules accepted: Orders

## 2021-08-07 LAB — TSH: TSH: 1.24 u[IU]/mL (ref 0.450–4.500)

## 2021-08-07 LAB — COMPREHENSIVE METABOLIC PANEL
ALT: 11 IU/L (ref 0–32)
AST: 18 IU/L (ref 0–40)
Albumin/Globulin Ratio: 2.6 — ABNORMAL HIGH (ref 1.2–2.2)
Albumin: 4.9 g/dL (ref 3.8–4.9)
Alkaline Phosphatase: 56 IU/L (ref 44–121)
BUN/Creatinine Ratio: 16 (ref 9–23)
BUN: 15 mg/dL (ref 6–24)
Bilirubin Total: 0.2 mg/dL (ref 0.0–1.2)
CO2: 26 mmol/L (ref 20–29)
Calcium: 9.8 mg/dL (ref 8.7–10.2)
Chloride: 98 mmol/L (ref 96–106)
Creatinine, Ser: 0.96 mg/dL (ref 0.57–1.00)
Globulin, Total: 1.9 g/dL (ref 1.5–4.5)
Glucose: 100 mg/dL — ABNORMAL HIGH (ref 70–99)
Potassium: 4.6 mmol/L (ref 3.5–5.2)
Sodium: 137 mmol/L (ref 134–144)
Total Protein: 6.8 g/dL (ref 6.0–8.5)
eGFR: 69 mL/min/{1.73_m2} (ref >59)

## 2021-08-07 LAB — LIPID PANEL
Chol/HDL Ratio: 4 ratio (ref 0.0–4.4)
Cholesterol, Total: 289 mg/dL — ABNORMAL HIGH (ref 100–199)
HDL: 73 mg/dL (ref >39)
LDL Chol Calc (NIH): 206 mg/dL — ABNORMAL HIGH (ref 0–99)
Triglycerides: 69 mg/dL (ref 0–149)
VLDL Cholesterol Cal: 10 mg/dL (ref 5–40)

## 2021-08-07 LAB — ESTROGENS, TOTAL: Estrogen: 40 pg/mL (ref 40–244)

## 2021-08-07 LAB — VITAMIN B12: Vitamin B-12: >2000 pg/mL — ABNORMAL HIGH (ref 232–1245)

## 2021-08-07 LAB — FSH/LH
FSH: 110 m[IU]/mL
LH: 52.9 m[IU]/mL

## 2021-08-07 LAB — VITAMIN D 25 HYDROXY (VIT D DEFICIENCY, FRACTURES): Vit D, 25-Hydroxy: 52.9 ng/mL (ref 30.0–100.0)

## 2021-08-07 LAB — ANA: Anti Nuclear Antibody (ANA): NEGATIVE

## 2021-08-15 DIAGNOSIS — F4320 Adjustment disorder, unspecified: Secondary | ICD-10-CM | POA: Diagnosis not present

## 2021-08-22 DIAGNOSIS — M21612 Bunion of left foot: Secondary | ICD-10-CM | POA: Diagnosis not present

## 2021-08-22 DIAGNOSIS — M76822 Posterior tibial tendinitis, left leg: Secondary | ICD-10-CM | POA: Diagnosis not present

## 2021-08-22 DIAGNOSIS — M2142 Flat foot [pes planus] (acquired), left foot: Secondary | ICD-10-CM | POA: Diagnosis not present

## 2021-08-22 DIAGNOSIS — M2012 Hallux valgus (acquired), left foot: Secondary | ICD-10-CM | POA: Diagnosis not present

## 2021-08-22 DIAGNOSIS — M2140 Flat foot [pes planus] (acquired), unspecified foot: Secondary | ICD-10-CM | POA: Diagnosis not present

## 2021-08-23 DIAGNOSIS — S96212D Strain of intrinsic muscle and tendon at ankle and foot level, left foot, subsequent encounter: Secondary | ICD-10-CM | POA: Diagnosis not present

## 2021-08-23 DIAGNOSIS — R262 Difficulty in walking, not elsewhere classified: Secondary | ICD-10-CM | POA: Diagnosis not present

## 2021-08-23 DIAGNOSIS — M6281 Muscle weakness (generalized): Secondary | ICD-10-CM | POA: Diagnosis not present

## 2021-08-27 DIAGNOSIS — S96212D Strain of intrinsic muscle and tendon at ankle and foot level, left foot, subsequent encounter: Secondary | ICD-10-CM | POA: Diagnosis not present

## 2021-08-27 DIAGNOSIS — R262 Difficulty in walking, not elsewhere classified: Secondary | ICD-10-CM | POA: Diagnosis not present

## 2021-08-27 DIAGNOSIS — M6281 Muscle weakness (generalized): Secondary | ICD-10-CM | POA: Diagnosis not present

## 2021-08-30 DIAGNOSIS — M6281 Muscle weakness (generalized): Secondary | ICD-10-CM | POA: Diagnosis not present

## 2021-08-30 DIAGNOSIS — R262 Difficulty in walking, not elsewhere classified: Secondary | ICD-10-CM | POA: Diagnosis not present

## 2021-08-30 DIAGNOSIS — S96212D Strain of intrinsic muscle and tendon at ankle and foot level, left foot, subsequent encounter: Secondary | ICD-10-CM | POA: Diagnosis not present

## 2021-09-02 DIAGNOSIS — F4323 Adjustment disorder with mixed anxiety and depressed mood: Secondary | ICD-10-CM | POA: Diagnosis not present

## 2021-09-03 DIAGNOSIS — R262 Difficulty in walking, not elsewhere classified: Secondary | ICD-10-CM | POA: Diagnosis not present

## 2021-09-03 DIAGNOSIS — S96212D Strain of intrinsic muscle and tendon at ankle and foot level, left foot, subsequent encounter: Secondary | ICD-10-CM | POA: Diagnosis not present

## 2021-09-03 DIAGNOSIS — M6281 Muscle weakness (generalized): Secondary | ICD-10-CM | POA: Diagnosis not present

## 2021-09-05 DIAGNOSIS — S96212D Strain of intrinsic muscle and tendon at ankle and foot level, left foot, subsequent encounter: Secondary | ICD-10-CM | POA: Diagnosis not present

## 2021-09-05 DIAGNOSIS — M6281 Muscle weakness (generalized): Secondary | ICD-10-CM | POA: Diagnosis not present

## 2021-09-05 DIAGNOSIS — R262 Difficulty in walking, not elsewhere classified: Secondary | ICD-10-CM | POA: Diagnosis not present

## 2021-09-06 ENCOUNTER — Telehealth: Payer: Self-pay

## 2021-09-06 DIAGNOSIS — R8290 Unspecified abnormal findings in urine: Secondary | ICD-10-CM | POA: Diagnosis not present

## 2021-09-06 NOTE — Telephone Encounter (Signed)
Received fax from [facility] with notes requesting PAP FROM 02/01/21.

## 2021-09-07 ENCOUNTER — Emergency Department (HOSPITAL_BASED_OUTPATIENT_CLINIC_OR_DEPARTMENT_OTHER)
Admission: EM | Admit: 2021-09-07 | Discharge: 2021-09-08 | Disposition: A | Discharge status: Home / Self Care | Payer: BC Managed Care – PPO | Attending: Emergency Medicine | Admitting: Emergency Medicine

## 2021-09-07 ENCOUNTER — Emergency Department (HOSPITAL_BASED_OUTPATIENT_CLINIC_OR_DEPARTMENT_OTHER): Payer: BC Managed Care – PPO

## 2021-09-07 ENCOUNTER — Encounter (HOSPITAL_BASED_OUTPATIENT_CLINIC_OR_DEPARTMENT_OTHER): Payer: Self-pay

## 2021-09-07 ENCOUNTER — Other Ambulatory Visit: Payer: Self-pay

## 2021-09-07 ENCOUNTER — Telehealth: Payer: Self-pay | Admitting: Family Medicine

## 2021-09-07 DIAGNOSIS — N898 Other specified noninflammatory disorders of vagina: Secondary | ICD-10-CM | POA: Diagnosis not present

## 2021-09-07 DIAGNOSIS — R509 Fever, unspecified: Secondary | ICD-10-CM | POA: Insufficient documentation

## 2021-09-07 DIAGNOSIS — R112 Nausea with vomiting, unspecified: Secondary | ICD-10-CM | POA: Diagnosis not present

## 2021-09-07 DIAGNOSIS — R102 Pelvic and perineal pain: Secondary | ICD-10-CM

## 2021-09-07 LAB — WET PREP, GENITAL
Clue Cells Wet Prep HPF POC: NONE SEEN
Sperm: NONE SEEN
Trich, Wet Prep: NONE SEEN
WBC, Wet Prep HPF POC: >=10 — AB (ref <10)
Yeast Wet Prep HPF POC: NONE SEEN

## 2021-09-07 LAB — URINALYSIS, ROUTINE W REFLEX MICROSCOPIC
Bilirubin Urine: NEGATIVE
Glucose, UA: NEGATIVE mg/dL
Ketones, ur: 15 mg/dL — AB
Nitrite: NEGATIVE
Protein, ur: 30 mg/dL — AB
RBC / HPF: >50 RBC/hpf — ABNORMAL HIGH (ref 0–5)
Specific Gravity, Urine: 1.02 (ref 1.005–1.030)
WBC, UA: >50 WBC/hpf — ABNORMAL HIGH (ref 0–5)
pH: 6.5 (ref 5.0–8.0)

## 2021-09-07 LAB — BASIC METABOLIC PANEL
Anion gap: 10 (ref 5–15)
BUN: 11 mg/dL (ref 6–20)
CO2: 26 mmol/L (ref 22–32)
Calcium: 9.6 mg/dL (ref 8.9–10.3)
Chloride: 97 mmol/L — ABNORMAL LOW (ref 98–111)
Creatinine, Ser: 0.98 mg/dL (ref 0.44–1.00)
GFR, Estimated: >60 mL/min (ref >60)
Glucose, Bld: 166 mg/dL — ABNORMAL HIGH (ref 70–99)
Potassium: 3.6 mmol/L (ref 3.5–5.1)
Sodium: 133 mmol/L — ABNORMAL LOW (ref 135–145)

## 2021-09-07 LAB — CBC
HCT: 39.3 % (ref 36.0–46.0)
Hemoglobin: 13.1 g/dL (ref 12.0–15.0)
MCH: 30 pg (ref 26.0–34.0)
MCHC: 33.3 g/dL (ref 30.0–36.0)
MCV: 90.1 fL (ref 80.0–100.0)
Platelets: 253 10*3/uL (ref 150–400)
RBC: 4.36 MIL/uL (ref 3.87–5.11)
RDW: 12.7 % (ref 11.5–15.5)
WBC: 10.6 10*3/uL — ABNORMAL HIGH (ref 4.0–10.5)
nRBC: 0 % (ref 0.0–0.2)

## 2021-09-07 MED ORDER — ACETAMINOPHEN 325 MG PO TABS
650.0000 mg | ORAL_TABLET | Freq: Once | ORAL | Status: AC | PRN
  Administered 2021-09-07: 650 mg via ORAL
  Filled 2021-09-07: qty 2

## 2021-09-07 MED ORDER — IBUPROFEN 800 MG PO TABS
800.0000 mg | ORAL_TABLET | Freq: Once | ORAL | Status: AC
Start: 2021-09-07 — End: 2021-09-07
  Administered 2021-09-07: 800 mg via ORAL
  Filled 2021-09-07: qty 1

## 2021-09-07 MED ORDER — ONDANSETRON 4 MG PO TBDP
4.0000 mg | ORAL_TABLET | Freq: Once | ORAL | Status: AC | PRN
  Administered 2021-09-07: 4 mg via ORAL
  Filled 2021-09-07: qty 1

## 2021-09-07 NOTE — ED Provider Notes (Signed)
[facility] EMERGENCY DEPT Provider Note   CSN: [id number] Arrival date & time: 09/07/21  1754     History  Chief Complaint  Patient presents with   Fever    Kristina Newton is a 56 y.o. female.  HPI  56 year old female presents the emergency department with concern for vaginal discharge, fever, nausea/vomiting.  Patient states she has been having discharge since Wednesday.  She describes it as thin, green/yellow, no odor.  States that she went to a walk-in clinic yesterday, they did a urinalysis and placed her on Macrobid.  Nausea and vomiting has continued today and her fever at home reached 102 so she came in for evaluation.  She started having lower right pelvic discomfort.  States she is had symptoms similar to this in the past but not as severe and was diagnosed with bacterial vaginosis which resolved with Flagyl.  She is currently sexually active with her current partner who is at bedside.  Home Medications Prior to Admission medications   Medication Sig Start Date End Date Taking? Authorizing Provider  Cholecalciferol (VITAMIN D3 PO) Take by mouth daily.    [provider]  famotidine-calcium carbonate-magnesium hydroxide (PEPCID COMPLETE) [phone] MG chewable tablet Chew 1 tablet by mouth daily as needed.    [provider]  Multiple Vitamins-Minerals (CENTRUM SILVER 50+WOMEN PO) Take by mouth daily.    [provider]  vitamin B-12 (CYANOCOBALAMIN) 1000 MCG tablet Take 1,000 mcg by mouth daily.    [provider]      Allergies    Benadryl itch stopping [diphenhydramine hcl]    Review of Systems   Review of Systems  Constitutional:  Positive for fever.  Respiratory:  Negative for shortness of breath.   Cardiovascular:  Negative for chest pain.  Gastrointestinal:  Positive for nausea and vomiting. Negative for abdominal pain and diarrhea.  Genitourinary:  Positive for pelvic pain and vaginal discharge.  Skin:   Negative for rash.  Neurological:  Negative for headaches.   Physical Exam Updated Vital Signs BP (!) 92/56    Pulse 67    Temp (!) 101 F (38.3 C) (Oral)    Resp 18    Ht 5\' 4"  (1.626 m)    Wt 53.5 kg    LMP 12/30/2014    SpO2 97%    BMI 20.25 kg/m  Physical Exam Vitals and nursing note reviewed.  Constitutional:      General: She is not in acute distress.    Appearance: Normal appearance. She is not diaphoretic.  HENT:     Head: Normocephalic.     Mouth/Throat:     Mouth: Mucous membranes are moist.  Cardiovascular:     Rate and Rhythm: Normal rate.  Pulmonary:     Effort: Pulmonary effort is normal. No respiratory distress.  Abdominal:     Palpations: Abdomen is soft.     Tenderness: There is no abdominal tenderness.  Genitourinary:    Comments: Chaperoned by RN Verlene Mayer, normal external genitalia, slight discomfort with speculum exam, thin yellow/green discharge with no odor, small amount of bleeding on the cervix, initial CMT which the patient then denied, right adnexal pain Skin:    General: Skin is warm.  Neurological:     Mental Status: She is alert and oriented to person, place, and time. Mental status is at baseline.  Psychiatric:        Mood and Affect: Mood normal.    ED Results / Procedures / Treatments   Labs (  all labs ordered are listed, but only abnormal results are displayed) Labs Reviewed  WET PREP, GENITAL - Abnormal; Notable for the following components:      Result Value   WBC, Wet Prep HPF POC >=10 (*)    All other components within normal limits  URINALYSIS, ROUTINE W REFLEX MICROSCOPIC - Abnormal; Notable for the following components:   APPearance HAZY (*)    Hgb urine dipstick SMALL (*)    Ketones, ur 15 (*)    Protein, ur 30 (*)    Leukocytes,Ua LARGE (*)    RBC / HPF >50 (*)    WBC, UA >50 (*)    All other components within normal limits  CBC - Abnormal; Notable for the following components:   WBC 10.6 (*)    All other components  within normal limits  BASIC METABOLIC PANEL - Abnormal; Notable for the following components:   Sodium 133 (*)    Chloride 97 (*)    Glucose, Bld 166 (*)    All other components within normal limits  URINE CULTURE  GC/CHLAMYDIA PROBE AMP ([place]) NOT AT [facility]    EKG None  Radiology No results found.  Procedures Procedures    Medications Ordered in ED Medications  ondansetron (ZOFRAN-ODT) disintegrating tablet 4 mg (4 mg Oral Given 09/07/21 1842)  acetaminophen (TYLENOL) tablet 650 mg (650 mg Oral Given 09/07/21 1842)  ibuprofen (ADVIL) tablet 800 mg (800 mg Oral Given 09/07/21 2309)    ED Course/ Medical Decision Making/ A&P                           Medical Decision Making Amount and/or Complexity of Data Reviewed Labs: ordered. Radiology: ordered.  Risk OTC drugs. Prescription drug management.   56 year old female presents emerged apartment for vaginal discharge, fever, nausea/vomiting.  She is febrile on arrival, soft pressures with systolics in the 0000000 however the patient states that this is normal for her.  Patient at one point was tachycardic but no other findings of SIRS/sepsis.  Urinalysis has ketones and large amount of leukocytes but is nitrate negative without any bacteria.  Pelvic has thin green/yellow discharge with some cervical bleeding, tenderness and right adnexal pain.  Wet prep shows white blood cells and no other acute findings.  Lower suspicion that this would be bacterial vaginosis given that.  My concern would be that this is GC/chlamydia with possible tubo-ovarian abscess given the fever, nausea/vomiting, pelvic pain.  This seems more systemic.  Patient does not believe that she has an STD.  She is requesting that I prescribe her Flagyl.  I was able to convince her to get a pelvic ultrasound done to rule out any ovarian pathology.  She is been given Tylenol and ibuprofen for fever.  She is currently denying any treatment for GC/chlamydia.  Patient is  pending [place], patient signed out to Dr. Roslynn Amble.         Final Clinical Impression(s) / ED Diagnoses Final diagnoses:  None    Rx / DC Orders ED Discharge Orders     None         Lorelle Gibbs, DO 09/07/21 2349

## 2021-09-07 NOTE — ED Triage Notes (Signed)
Patient here POV from Home with Fever.  Patient endorses Vaginal Discharge since Wednesday. Patient was prescribed Macrobid yesterday and has taken two doses.  Patient endorses Episode of Nausea and Vomiting as well as a Fever today.  Temp: 102.1; Tylenol at 1230.  NAD Noted during Triage. A&Ox4. GCS 15. BIB Wheelchair.

## 2021-09-07 NOTE — Telephone Encounter (Signed)
Patient states she reached out to her GYN yesterday.  She dropped off a urine specimen, and was started on macrobid yesterday for possibly UTI.  She has had 2 doses so far, and today is febrile to 102, vomited, feels shaky and weak.  She didn't actually have evaluation with GYN, just dropped of urine.  She reports there were white cells in the urine, and thinks it was sent for culture (through GYN, who isn't on epic).  When asked about her original symptoms, she reported seeing light green vaginal discharge Mild urinary frequency, and only slight discomfort with voiding.  Her voice sounds shaky, she recently vomited, sounds weak.  Recommended that she be evaluated at urgent care (if she had typical UTI symptoms, I would have changed her ABX to cipro and given her zofran, treating her for pyelo, but given chief complaint of discolored vaginal discharge and fever, this needs a pelvic exam/evaluation).

## 2021-09-08 DIAGNOSIS — R10813 Right lower quadrant abdominal tenderness: Secondary | ICD-10-CM | POA: Diagnosis not present

## 2021-09-08 MED ORDER — CEPHALEXIN 500 MG PO CAPS: 500.0000 mg | ORAL_CAPSULE | Freq: Four times a day (QID) | ORAL | 0 refills | Status: AC

## 2021-09-08 MED ORDER — CEPHALEXIN 250 MG PO CAPS
500.0000 mg | ORAL_CAPSULE | Freq: Once | ORAL | Status: AC
  Administered 2021-09-08: 500 mg via ORAL
  Filled 2021-09-08: qty 2

## 2021-09-08 NOTE — ED Provider Notes (Signed)
Sign out note  56 year old lady presented to ER with concern for vaginal discharge, fever, nausea and vomiting.  Pelvic exam noted yellow-green discharge, TVUS ordered to rule out TOA.  At time of signout, plan to follow-up TVUS.  Patient had been offered empiric treatment for STDs however adamantly declined this treatment.  TVUS negative.  Reassessed patient, noted blood pressure has been soft.  Differential diagnosis still includes PID, STD, UTI.  I again discussed with patient recommendation to empirically treat for gonorrhea chlamydia but she declined.  Was agreeable to switch Macrobid to Keflex in case of UTI and pyelonephritis causing this fever.  Regarding the soft blood pressure, I discussed concerns for severe infection and recommended further observation in ER and IV fluids.  Patient however states that she feels fine and wishes to be discharged.  Repeat blood pressure was improved and she is no longer febrile.  Feel she adequately understands my concerns and will discharge patient at this time.  Stressed need to follow-up in MyChart regarding her further testing.  If she is positive for gonorrhea and chlamydia she understands that she will need additional treatment.  Stressed need to follow-up with primary care doctor and have low threshold for return to ER.   Milagros Loll, MD 09/08/21 [phone]

## 2021-09-08 NOTE — Discharge Instructions (Signed)
Please follow-up with your primary care doctor.  Take the newly prescribed antibiotic.  If you have persistent fevers, vomiting, any abdominal pain or other new concerning symptom, please return to ER for reassessment.

## 2021-09-09 ENCOUNTER — Telehealth: Payer: Self-pay | Admitting: Medical

## 2021-09-09 LAB — URINE CULTURE: Culture: >=100000 — AB

## 2021-09-09 LAB — GC/CHLAMYDIA PROBE AMP (~~LOC~~) NOT AT ARMC
Chlamydia: NEGATIVE
Comment: NEGATIVE
Comment: NORMAL
Neisseria Gonorrhea: NEGATIVE

## 2021-09-09 NOTE — Telephone Encounter (Signed)
Left message for patient concerning recent ER visit. Pt was advised to call and make a Appt.

## 2021-09-10 ENCOUNTER — Telehealth: Payer: Self-pay

## 2021-09-10 DIAGNOSIS — R262 Difficulty in walking, not elsewhere classified: Secondary | ICD-10-CM | POA: Diagnosis not present

## 2021-09-10 DIAGNOSIS — M6281 Muscle weakness (generalized): Secondary | ICD-10-CM | POA: Diagnosis not present

## 2021-09-10 DIAGNOSIS — S96212D Strain of intrinsic muscle and tendon at ankle and foot level, left foot, subsequent encounter: Secondary | ICD-10-CM | POA: Diagnosis not present

## 2021-09-10 NOTE — Telephone Encounter (Signed)
Post ED Visit - Positive Culture Follow-up  Culture report reviewed by antimicrobial stewardship pharmacist: [facility] []  , Pharm.D. [x]  Enzo Bi, Pharm.D., BCPS AQ-ID []  , Pharm.D., BCPS []  Celedonio Miyamoto, .D., BCPS []  [place], .D., BCPS, AAHIVP []  Georgina Pillion, Pharm.D., BCPS, AAHIVP []  [address], PharmD, BCPS []  , PharmD, BCPS []  [place], PharmD, BCPS []  [address], PharmD []  , PharmD, BCPS []  Estella Husk, PharmD  Pharmacy Team []  Lysle Pearl, PharmD []  , PharmD []  Phillips Climes, PharmD []  , Rph []  Agapito Games) , PharmD []  Verlan Friends, PharmD []  , PharmD []  Mervyn Gay, PharmD []  , PharmD []  Vinnie Level, PharmD []  [facility], PharmD []  , PharmD []  Len Childs, PharmD   Positive urine culture Treated with Cephalexin, organism sensitive to the same and no further patient follow-up is required at this time.  09/10/2021, 3:12 PM

## 2021-09-11 ENCOUNTER — Encounter: Payer: Self-pay | Admitting: Internal Medicine

## 2021-09-12 DIAGNOSIS — M6281 Muscle weakness (generalized): Secondary | ICD-10-CM | POA: Diagnosis not present

## 2021-09-12 DIAGNOSIS — R262 Difficulty in walking, not elsewhere classified: Secondary | ICD-10-CM | POA: Diagnosis not present

## 2021-09-12 DIAGNOSIS — S96212D Strain of intrinsic muscle and tendon at ankle and foot level, left foot, subsequent encounter: Secondary | ICD-10-CM | POA: Diagnosis not present

## 2021-09-16 DIAGNOSIS — F4323 Adjustment disorder with mixed anxiety and depressed mood: Secondary | ICD-10-CM | POA: Diagnosis not present

## 2021-09-17 DIAGNOSIS — M6281 Muscle weakness (generalized): Secondary | ICD-10-CM | POA: Diagnosis not present

## 2021-09-17 DIAGNOSIS — S96212D Strain of intrinsic muscle and tendon at ankle and foot level, left foot, subsequent encounter: Secondary | ICD-10-CM | POA: Diagnosis not present

## 2021-09-17 DIAGNOSIS — R262 Difficulty in walking, not elsewhere classified: Secondary | ICD-10-CM | POA: Diagnosis not present

## 2021-09-19 DIAGNOSIS — M6281 Muscle weakness (generalized): Secondary | ICD-10-CM | POA: Diagnosis not present

## 2021-09-19 DIAGNOSIS — S96212D Strain of intrinsic muscle and tendon at ankle and foot level, left foot, subsequent encounter: Secondary | ICD-10-CM | POA: Diagnosis not present

## 2021-09-19 DIAGNOSIS — R262 Difficulty in walking, not elsewhere classified: Secondary | ICD-10-CM | POA: Diagnosis not present

## 2021-09-30 DIAGNOSIS — F4323 Adjustment disorder with mixed anxiety and depressed mood: Secondary | ICD-10-CM | POA: Diagnosis not present

## 2021-10-14 DIAGNOSIS — F4323 Adjustment disorder with mixed anxiety and depressed mood: Secondary | ICD-10-CM | POA: Diagnosis not present

## 2022-01-08 ENCOUNTER — Other Ambulatory Visit: Payer: Self-pay | Admitting: Family Medicine

## 2022-01-08 DIAGNOSIS — Z1231 Encounter for screening mammogram for malignant neoplasm of breast: Secondary | ICD-10-CM

## 2022-01-14 DIAGNOSIS — Z01419 Encounter for gynecological examination (general) (routine) without abnormal findings: Secondary | ICD-10-CM | POA: Diagnosis not present

## 2022-01-14 DIAGNOSIS — Z124 Encounter for screening for malignant neoplasm of cervix: Secondary | ICD-10-CM | POA: Diagnosis not present

## 2022-01-14 LAB — HM PAP SMEAR: HM Pap smear: NORMAL

## 2022-01-14 LAB — RESULTS CONSOLE HPV: CHL HPV: NEGATIVE

## 2022-01-16 ENCOUNTER — Encounter: Payer: Self-pay | Admitting: Internal Medicine

## 2022-01-22 DIAGNOSIS — R300 Dysuria: Secondary | ICD-10-CM | POA: Diagnosis not present

## 2022-01-22 DIAGNOSIS — Z113 Encounter for screening for infections with a predominantly sexual mode of transmission: Secondary | ICD-10-CM | POA: Diagnosis not present

## 2022-01-22 DIAGNOSIS — N3000 Acute cystitis without hematuria: Secondary | ICD-10-CM | POA: Diagnosis not present

## 2022-01-24 ENCOUNTER — Ambulatory Visit: Payer: BC Managed Care – PPO

## 2022-01-24 ENCOUNTER — Ambulatory Visit
Admission: RE | Admit: 2022-01-24 | Discharge: 2022-01-24 | Disposition: A | Discharge status: Home / Self Care | Payer: BC Managed Care – PPO | Source: Ambulatory Visit | Attending: Family Medicine | Admitting: Family Medicine

## 2022-01-24 DIAGNOSIS — Z1231 Encounter for screening mammogram for malignant neoplasm of breast: Secondary | ICD-10-CM

## 2022-02-26 DIAGNOSIS — N760 Acute vaginitis: Secondary | ICD-10-CM | POA: Diagnosis not present

## 2022-04-23 ENCOUNTER — Encounter: Payer: Self-pay | Admitting: Internal Medicine

## 2022-05-22 DIAGNOSIS — Z6820 Body mass index (BMI) 20.0-20.9, adult: Secondary | ICD-10-CM | POA: Diagnosis not present

## 2022-05-22 DIAGNOSIS — N951 Menopausal and female climacteric states: Secondary | ICD-10-CM | POA: Diagnosis not present

## 2022-05-28 DIAGNOSIS — L820 Inflamed seborrheic keratosis: Secondary | ICD-10-CM | POA: Diagnosis not present

## 2022-05-28 DIAGNOSIS — L821 Other seborrheic keratosis: Secondary | ICD-10-CM | POA: Diagnosis not present

## 2022-05-28 DIAGNOSIS — L578 Other skin changes due to chronic exposure to nonionizing radiation: Secondary | ICD-10-CM | POA: Diagnosis not present

## 2022-05-28 DIAGNOSIS — L814 Other melanin hyperpigmentation: Secondary | ICD-10-CM | POA: Diagnosis not present

## 2022-05-28 DIAGNOSIS — D225 Melanocytic nevi of trunk: Secondary | ICD-10-CM | POA: Diagnosis not present

## 2022-06-04 ENCOUNTER — Encounter: Payer: Self-pay | Admitting: Internal Medicine

## 2022-08-08 ENCOUNTER — Ambulatory Visit (INDEPENDENT_AMBULATORY_CARE_PROVIDER_SITE_OTHER): Payer: BC Managed Care – PPO | Admitting: Medical

## 2022-08-08 ENCOUNTER — Encounter: Payer: Self-pay | Admitting: Medical

## 2022-08-08 VITALS — BP 126/80 | HR 55 | Temp 97.7°F | Ht 63.75 in | Wt 118.8 lb

## 2022-08-08 DIAGNOSIS — Z0000 Encounter for general adult medical examination without abnormal findings: Secondary | ICD-10-CM | POA: Diagnosis not present

## 2022-08-08 DIAGNOSIS — R7301 Impaired fasting glucose: Secondary | ICD-10-CM | POA: Diagnosis not present

## 2022-08-08 DIAGNOSIS — E349 Endocrine disorder, unspecified: Secondary | ICD-10-CM | POA: Insufficient documentation

## 2022-08-08 DIAGNOSIS — Z131 Encounter for screening for diabetes mellitus: Secondary | ICD-10-CM | POA: Diagnosis not present

## 2022-08-08 DIAGNOSIS — E7801 Familial hypercholesterolemia: Secondary | ICD-10-CM | POA: Diagnosis not present

## 2022-08-08 DIAGNOSIS — Z13220 Encounter for screening for lipoid disorders: Secondary | ICD-10-CM

## 2022-08-08 DIAGNOSIS — Z136 Encounter for screening for cardiovascular disorders: Secondary | ICD-10-CM | POA: Diagnosis not present

## 2022-08-08 DIAGNOSIS — R5383 Other fatigue: Secondary | ICD-10-CM | POA: Diagnosis not present

## 2022-08-08 DIAGNOSIS — E559 Vitamin D deficiency, unspecified: Secondary | ICD-10-CM | POA: Diagnosis not present

## 2022-08-08 LAB — POCT URINALYSIS DIP (PROADVANTAGE DEVICE)
Bilirubin, UA: NEGATIVE
Blood, UA: NEGATIVE
Glucose, UA: NEGATIVE mg/dL
Ketones, POC UA: NEGATIVE mg/dL
Leukocytes, UA: NEGATIVE
Nitrite, UA: NEGATIVE
Protein Ur, POC: NEGATIVE mg/dL
Specific Gravity, Urine: 1.01
Urobilinogen, Ur: 0.2
pH, UA: 6 (ref 5.0–8.0)

## 2022-08-08 NOTE — Progress Notes (Signed)
Subjective:   HPI  Kristina Newton is a 56 y.o. female who presents for Chief Complaint  Patient presents with   Annual Exam    Fasting. No additional concerns.     Patient Care Team: Lacorey Brusca, Leward Quan as PCP - General (Family Medicine)  Medical team: Dr. Governor Specking, wound care/gen surgery Dr. Jerelyn Charles, gynecology Dr. Ophelia Charter, orthopedics Dr. Ila Mcgill, podiatry Dr. Lyman Bishop, cardiology Dr. Zenovia Jarred, GI Sees dentist Sees eye doctor Dr. Renda Rolls, Dermatology specialists   Concerns: Here for well visit.  She has recently been on hormone patch and is doing much better in general.  She feels significantly improved in general with how she feels.  She notes that she was having urinary tract infection recurrent but after starting hormones that has subsided.  She feels better cognitively, fatigue and myalgias much better.  She wished she would have started hormone therapy even earlier.  Getting a lot of fiber as well in diet.  Here for recheck on cholesterol.  She is eating mostly plant-based except for dairy and cheese.  Several family members have high cholesterol  She felt like she was getting vitamin D toxicity earlier with year with the supplements she was taking.    She wants some specific testing including hormones, vit D, lipoprofile   Reviewed their medical, surgical, family, social, medication, and allergy history and updated chart as appropriate.  Past Medical History:  Diagnosis Date   Arthritis    Cervical dysplasia    Fibrocystic breast disease    History of cervical dysplasia 06/2010   s/p LEEP ,  CIN 2   History of COVID-19 09/18/2020   per pt mild symptoms that resolved   PONV (postoperative nausea and vomiting)    Vitamin D deficiency 2020   Wears contact lenses    right eye only    Family History  Problem Relation Age of Onset   Cancer Mother        basal cell cancer   Hearing loss Mother    Macular degeneration  Mother    Dementia Mother    Cancer Father        basal cell cancer   Hypertension Father    Hyperlipidemia Father    Heart disease Father        arrythmia, stent/CAD?   Depression Sister    Stroke Neg Hx    Diabetes Neg Hx    Colon polyps Neg Hx    Colon cancer Neg Hx    Breast cancer Neg Hx      Current Outpatient Medications:    vitamin B-12 (CYANOCOBALAMIN) 1000 MCG tablet, Take 1,000 mcg by mouth daily., Disp: , Rfl:    estradiol (ESTRACE) 0.1 MG/GM vaginal cream, Place 1 Applicatorful vaginally. Every 3 days, Disp: , Rfl:    estradiol (VIVELLE-DOT) 0.025 MG/24HR, 1 patch 2 (two) times a week., Disp: , Rfl:    progesterone (PROMETRIUM) 100 MG capsule, Take 100 mg by mouth at bedtime., Disp: , Rfl:   Allergies  Allergen Reactions   Macrobid [Nitrofurantoin]     vomiting   Benadryl Itch Stopping [Diphenhydramine Hcl] Rash    topical      Review of Systems Constitutional: -fever, -chills, -sweats, -unexpected weight change, -decreased appetite, -fatigue Allergy: -sneezing, -itching, -congestion Dermatology: -changing moles, --rash, -lumps ENT: -runny nose, -ear pain, -sore throat, -hoarseness, -sinus pain, -teeth pain, - ringing in ears, -hearing loss, -nosebleeds Cardiology: -chest pain, -palpitations, -swelling, -difficulty breathing when lying flat, -  waking up short of breath Respiratory: -cough, -shortness of breath, -difficulty breathing with exercise or exertion, -wheezing, -coughing up blood Gastroenterology: -abdominal pain, -nausea, -vomiting, -diarrhea, -constipation, -blood in stool, -changes in bowel movement, -difficulty swallowing or eating Hematology: -bleeding, -bruising  Musculoskeletal: -joint aches, -muscle aches, -joint swelling, -back pain, -neck pain, -cramping, -changes in gait Ophthalmology: denies vision changes, eye redness, itching, discharge Urology: -burning with urination, -difficulty urinating, -blood in urine, -urinary frequency, -urgency,  -incontinence Neurology: -headache, -weakness, -tingling, -numbness, -memory loss, -falls, -dizziness Psychology: -depressed mood, -agitation, -sleep problems Breast/gyn: -breast tendnerss, -discharge, -lumps, -vaginal discharge,- irregular periods, -heavy periods      08/08/2022   10:32 AM 08/06/2021    8:21 AM 06/30/2019   12:06 PM 02/05/2016   12:12 PM  Depression screen PHQ 2/9  Decreased Interest 0 0 0 0  Down, Depressed, Hopeless 0 0 0 0  PHQ - 2 Score 0 0 0 0       Objective:  BP 126/80   Pulse (!) 55   Temp 97.7 F (36.5 C) (Oral)   Ht 5' 3.75" (1.619 m)   Wt 118 lb 12.8 oz (53.9 kg)   LMP 12/30/2014   SpO2 98% Comment: room air  BMI 20.55 kg/m   Wt Readings from Last 3 Encounters:  08/08/22 118 lb 12.8 oz (53.9 kg)  09/07/21 117 lb 15.1 oz (53.5 kg)  08/06/21 118 lb (53.5 kg)    General appearance: alert, no distress, WD/WN, Caucasian female Skin: unremarkable HEENT: normocephalic, conjunctiva/corneas normal, sclerae anicteric, PERRLA, EOMi Neck: supple, no lymphadenopathy, no thyromegaly, no masses, normal ROM, no bruits Chest: non tender, normal shape and expansion Heart: RRR, normal S1, S2, no murmurs Lungs: CTA bilaterally, no wheezes, rhonchi, or rales Abdomen: +bs, soft, non tender, non distended, no masses, no hepatomegaly, no splenomegaly, no bruits Back: non tender, no scoliosis Musculoskeletal: limited exam, unremarkable  Extremities: no edema, no cyanosis, no clubbing Pulses: 2+ symmetric, upper and lower extremities, normal cap refill Neurological: alert, oriented x 3, CN2-12 intact, strength normal upper extremities and lower extremities, sensation normal throughout, DTRs 2+ throughout, no cerebellar signs, gait normal Psychiatric: normal affect, behavior normal, pleasant  Breast/gyn/rectal - deferred to gynecology     Assessment and Plan :   Encounter Diagnoses  Name Primary?   Encounter for health maintenance examination in adult Yes    Screening for heart disease    Vitamin D deficiency    Other fatigue    Impaired fasting blood sugar    Screening for diabetes mellitus    Screening for lipid disorders    Familial hypercholesteremia    Hormone disturbance     This visit was a preventative care visit, also known as wellness visit or routine physical.   Topics typically include healthy lifestyle, diet, exercise, preventative care, vaccinations, sick and well care, proper use of emergency dept and after hours care, as well as other concerns.     Recommendations: Continue to return yearly for your annual wellness and preventative care visits.  This gives [place] a chance to discuss healthy lifestyle, exercise, vaccinations, review your chart record, and perform screenings where appropriate.  I recommend you see your eye doctor yearly for routine vision care.  I recommend you see your dentist yearly for routine dental care including hygiene visits twice yearly.   Vaccination recommendations were reviewed Immunization History  Administered Date(s) Administered   Hepatitis B, adult 03/28/2015, 04/30/2015, 02/05/2016   Influenza Split 05/26/2007, 05/31/2013, 06/16/2014, 04/18/2021   Influenza,inj,Quad  PF,6+ Mos 06/11/2017, 04/27/2019, 04/24/2021, 05/17/2022   Influenza-Unspecified 06/01/2014, 05/15/2018, 04/27/2019   MMR 12/01/2006, 07/15/2010   Moderna Covid-19 Vaccine Bivalent Booster 36yr & up 05/14/2021   Moderna SARS-COV2 Booster Vaccination 06/18/2020   Moderna Sars-Covid-2 Vaccination 10/17/2019, 11/14/2019, 01/19/2020   PPD Test 09/24/2007, 08/23/2008, 06/24/2010, 07/14/2011, 08/23/2012, 09/06/2013, 10/23/2014, 02/05/2016   Td 08/06/2021   Tdap 09/05/2010   Consider the shingles vaccine   Screening for cancer: Colon cancer screening: I reviewed your colonoscopy on file that is up to date from 06/2018   Breast cancer screening: You should perform a self breast exam monthly.   We reviewed recommendations  for regular mammograms and breast cancer screening.  I reviewed up to date mammogram.  Cervical cancer screening: Continue routine pap smears with gynecology   Skin cancer screening: Check your skin regularly for new changes, growing lesions, or other lesions of concern Come in for evaluation if you have skin lesions of concern.  Lung cancer screening: If you have a greater than 20 pack year history of tobacco use, then you may qualify for lung cancer screening with a chest CT scan.   Please call your insurance company to inquire about coverage for this test.  We currently don't have screenings for other cancers besides breast, cervical, colon, and lung cancers.  If you have a strong family history of cancer or have other cancer screening concerns, please let me know.    Bone health: Get at least 150 minutes of aerobic exercise weekly Get weight bearing exercise at least once weekly Bone density test:  A bone density test is an imaging test that uses a type of X-ray to measure the amount of calcium and other minerals in your bones. The test may be used to diagnose or screen you for a condition that causes weak or thin bones (osteoporosis), predict your risk for a broken bone (fracture), or determine how well your osteoporosis treatment is working. The bone density test is recommended for females 623and older, or females or males <<20if certain risk factors such as thyroid disease, long term use of steroids such as for asthma or rheumatological issues, vitamin D deficiency, estrogen deficiency, family history of osteoporosis, self or family history of fragility fracture in first degree relative.    Heart health: Get at least 150 minutes of aerobic exercise weekly Limit alcohol It is important to maintain a healthy blood pressure and healthy cholesterol numbers  Heart disease screening: Screening for heart disease includes screening for blood pressure, fasting lipids, glucose/diabetes  screening, BMI height to weight ratio, reviewed of smoking status, physical activity, and diet.    Goals include blood pressure 120/80 or less, maintaining a healthy lipid/cholesterol profile, preventing diabetes or keeping diabetes numbers under good control, not smoking or using tobacco products, exercising most days per week or at least 150 minutes per week of exercise, and eating healthy variety of fruits and vegetables, healthy oils, and avoiding unhealthy food choices like fried food, fast food, high sugar and high cholesterol foods.    Discussed testing, she will move forward with CT coronary screening   Medical care options: I recommend you continue to seek care here first for routine care.  We try really hard to have available appointments Monday through Friday daytime hours for sick visits, acute visits, and physicals.  Urgent care should be used for after hours and weekends for significant issues that cannot wait till the next day.  The emergency department should be used for significant potentially  life-threatening emergencies.  The emergency department is expensive, can often have long wait times for less significant concerns, so try to utilize primary care, urgent care, or telemedicine when possible to avoid unnecessary trips to the emergency department.  Virtual visits and telemedicine have been introduced since the pandemic started in 2020, and can be convenient ways to receive medical care.  We offer virtual appointments as well to assist you in a variety of options to seek medical care.   Separate significant issues discussed:  Vit D deficiency - not currently on supplement.  Updated lab today  Hyperlipidemia, family history of high cholesterol - updated labs today  Impaired glucose on prior labs - updated labs today, of note she is plant based except dairy   Hormone disturbance - she feels significantly better now being on hormone therapy  History of abnormal pap - followed by  gynecology  Laree was seen today for annual exam.  Diagnoses and all orders for this visit:  Encounter for health maintenance examination in adult -     POCT Urinalysis DIP (Proadvantage Device) -     Comprehensive metabolic panel -     CBC -     Hemoglobin A1c -     VITAMIN D 25 Hydroxy (Vit-D Deficiency, Fractures) -     Fructosamine -     NMR, lipoprofile -     Estrogens, Total -     Testosterone -     TSH -     CT CARDIAC SCORING (SELF PAY ONLY); Future  Screening for heart disease -     CT CARDIAC SCORING (SELF PAY ONLY); Future  Vitamin D deficiency -     VITAMIN D 25 Hydroxy (Vit-D Deficiency, Fractures)  Other fatigue  Impaired fasting blood sugar -     Hemoglobin A1c  Screening for diabetes mellitus -     Hemoglobin A1c  Screening for lipid disorders -     NMR, lipoprofile  Familial hypercholesteremia -     NMR, lipoprofile  Hormone disturbance -     Estrogens, Total -     Testosterone    Follow-up pending labs, yearly for physical

## 2022-08-13 NOTE — Progress Notes (Signed)
Results sent through MyChart

## 2022-08-15 LAB — FRUCTOSAMINE: Fructosamine: 249 umol/L (ref 0–285)

## 2022-08-15 LAB — COMPREHENSIVE METABOLIC PANEL
ALT: 10 IU/L (ref 0–32)
AST: 27 IU/L (ref 0–40)
Albumin/Globulin Ratio: 2.1 (ref 1.2–2.2)
Albumin: 4.4 g/dL (ref 3.8–4.9)
Alkaline Phosphatase: 48 IU/L (ref 44–121)
BUN/Creatinine Ratio: 12 (ref 9–23)
BUN: 11 mg/dL (ref 6–24)
Bilirubin Total: 0.6 mg/dL (ref 0.0–1.2)
CO2: 24 mmol/L (ref 20–29)
Calcium: 9.3 mg/dL (ref 8.7–10.2)
Chloride: 104 mmol/L (ref 96–106)
Creatinine, Ser: 0.89 mg/dL (ref 0.57–1.00)
Globulin, Total: 2.1 g/dL (ref 1.5–4.5)
Glucose: 93 mg/dL (ref 70–99)
Potassium: 4.6 mmol/L (ref 3.5–5.2)
Sodium: 137 mmol/L (ref 134–144)
Total Protein: 6.5 g/dL (ref 6.0–8.5)
eGFR: 75 mL/min/{1.73_m2} (ref >59)

## 2022-08-15 LAB — NMR, LIPOPROFILE
Cholesterol, Total: 249 mg/dL — ABNORMAL HIGH (ref 100–199)
HDL Particle Number: 41.6 umol/L (ref >=30.5)
HDL-C: 70 mg/dL (ref >39)
LDL Particle Number: 1510 nmol/L — ABNORMAL HIGH (ref <1000)
LDL Size: 22 nm (ref >20.5)
LDL-C (NIH Calc): 165 mg/dL — ABNORMAL HIGH (ref 0–99)
LP-IR Score: <25 (ref <=45)
Small LDL Particle Number: 274 nmol/L (ref <=527)
Triglycerides: 81 mg/dL (ref 0–149)

## 2022-08-15 LAB — TSH: TSH: 1.36 u[IU]/mL (ref 0.450–4.500)

## 2022-08-15 LAB — CBC
Hematocrit: 40.9 % (ref 34.0–46.6)
Hemoglobin: 13.6 g/dL (ref 11.1–15.9)
MCH: 30.2 pg (ref 26.6–33.0)
MCHC: 33.3 g/dL (ref 31.5–35.7)
MCV: 91 fL (ref 79–97)
Platelets: 277 10*3/uL (ref 150–450)
RBC: 4.5 x10E6/uL (ref 3.77–5.28)
RDW: 12.7 % (ref 11.7–15.4)
WBC: 3.8 10*3/uL (ref 3.4–10.8)

## 2022-08-15 LAB — ESTROGENS, TOTAL: Estrogen: 110 pg/mL (ref 40–244)

## 2022-08-15 LAB — VITAMIN D 25 HYDROXY (VIT D DEFICIENCY, FRACTURES): Vit D, 25-Hydroxy: 27.8 ng/mL — ABNORMAL LOW (ref 30.0–100.0)

## 2022-08-15 LAB — HEMOGLOBIN A1C
Est. average glucose Bld gHb Est-mCnc: 108 mg/dL
Hgb A1c MFr Bld: 5.4 % (ref 4.8–5.6)

## 2022-08-15 LAB — TESTOSTERONE: Testosterone: <3 ng/dL — ABNORMAL LOW (ref 4–50)

## 2022-08-15 NOTE — Progress Notes (Signed)
Results sent through MyChart

## 2022-08-26 ENCOUNTER — Ambulatory Visit (HOSPITAL_COMMUNITY)
Admission: RE | Admit: 2022-08-26 | Discharge: 2022-08-26 | Disposition: A | Discharge status: Home / Self Care | Payer: BC Managed Care – PPO | Source: Ambulatory Visit | Attending: Medical | Admitting: Medical

## 2022-08-26 DIAGNOSIS — Z136 Encounter for screening for cardiovascular disorders: Secondary | ICD-10-CM

## 2022-08-26 DIAGNOSIS — Z0000 Encounter for general adult medical examination without abnormal findings: Secondary | ICD-10-CM

## 2022-08-27 NOTE — Progress Notes (Signed)
Results sent through MyChart

## 2022-09-04 ENCOUNTER — Other Ambulatory Visit: Payer: Self-pay | Admitting: Medical

## 2022-09-04 DIAGNOSIS — E559 Vitamin D deficiency, unspecified: Secondary | ICD-10-CM

## 2022-09-04 DIAGNOSIS — E349 Endocrine disorder, unspecified: Secondary | ICD-10-CM

## 2022-09-04 DIAGNOSIS — Z780 Asymptomatic menopausal state: Secondary | ICD-10-CM

## 2022-09-04 DIAGNOSIS — Z8781 Personal history of (healed) traumatic fracture: Secondary | ICD-10-CM

## 2022-10-14 DIAGNOSIS — R6882 Decreased libido: Secondary | ICD-10-CM | POA: Diagnosis not present

## 2022-10-14 DIAGNOSIS — Z6820 Body mass index (BMI) 20.0-20.9, adult: Secondary | ICD-10-CM | POA: Diagnosis not present

## 2022-10-14 DIAGNOSIS — N959 Unspecified menopausal and perimenopausal disorder: Secondary | ICD-10-CM | POA: Diagnosis not present

## 2022-11-06 ENCOUNTER — Other Ambulatory Visit: Payer: Self-pay | Admitting: Family Medicine

## 2022-11-06 DIAGNOSIS — Z1231 Encounter for screening mammogram for malignant neoplasm of breast: Secondary | ICD-10-CM

## 2023-01-15 DIAGNOSIS — Z79890 Hormone replacement therapy (postmenopausal): Secondary | ICD-10-CM | POA: Diagnosis not present

## 2023-01-15 DIAGNOSIS — E559 Vitamin D deficiency, unspecified: Secondary | ICD-10-CM | POA: Diagnosis not present

## 2023-01-15 DIAGNOSIS — N951 Menopausal and female climacteric states: Secondary | ICD-10-CM | POA: Diagnosis not present

## 2023-01-15 DIAGNOSIS — Z124 Encounter for screening for malignant neoplasm of cervix: Secondary | ICD-10-CM | POA: Diagnosis not present

## 2023-01-15 DIAGNOSIS — N898 Other specified noninflammatory disorders of vagina: Secondary | ICD-10-CM | POA: Diagnosis not present

## 2023-01-15 DIAGNOSIS — B3731 Acute candidiasis of vulva and vagina: Secondary | ICD-10-CM | POA: Diagnosis not present

## 2023-01-15 DIAGNOSIS — Z01411 Encounter for gynecological examination (general) (routine) with abnormal findings: Secondary | ICD-10-CM | POA: Diagnosis not present

## 2023-01-20 IMAGING — MR MR ANKLE*L* W/O CM
4 of 5 series · 12 of 40 positions shown · non-contrast
Comparison: None.

CLINICAL DATA: History of running, possible posterior tibialis
tendon tear

EXAM:
MRI OF THE LEFT ANKLE WITHOUT CONTRAST
TECHNIQUE: Multiplanar, multisequence MR imaging of the ankle was performed. No
intravenous contrast was administered.

[Series 3: PD fat-sat · axial · left · 3.0mm · 0.25mm/px · z∈[-89,+15]mm · 3 of 34 slices shown]
[im 4/34]
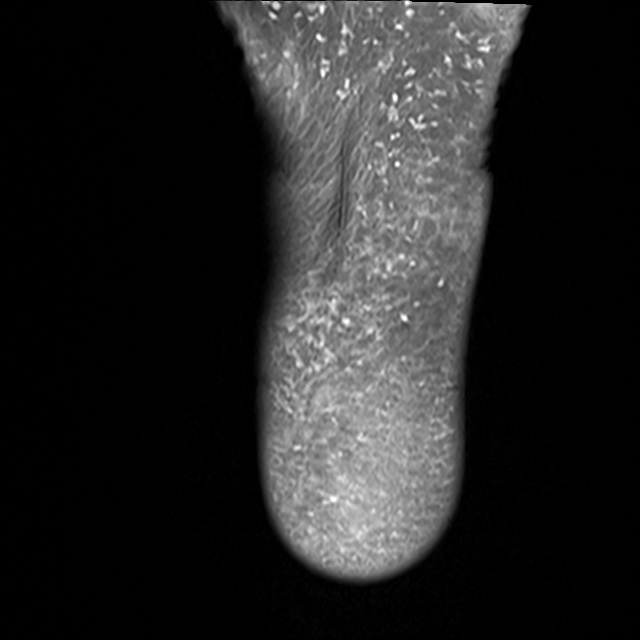
[im 19/34]
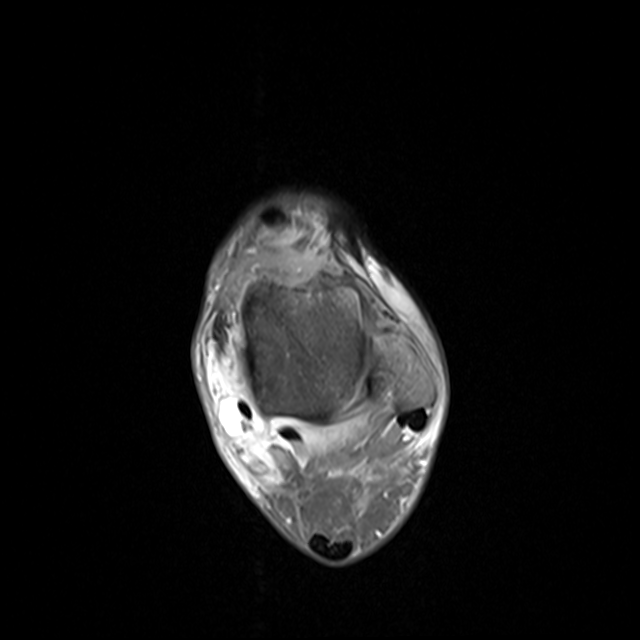
[im 30/34]
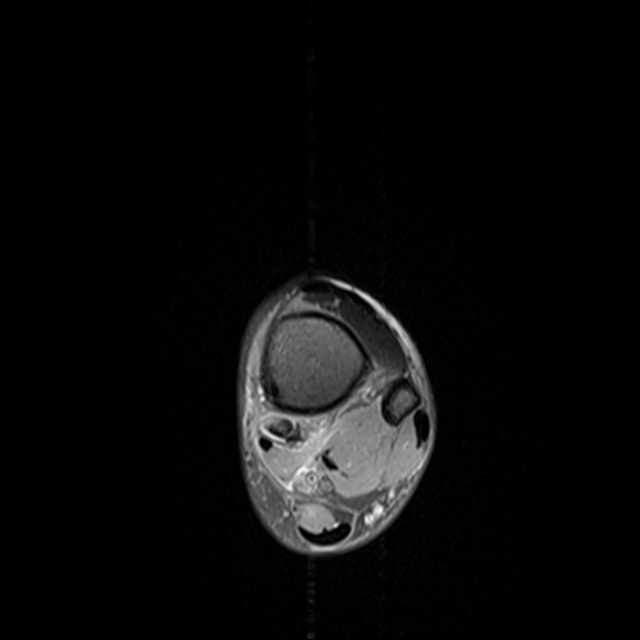

[Series 4: T2 fat-sat · axial · left · 3.0mm · 0.25mm/px · z∈[-89,+15]mm · 3 of 34 slices shown (1 of 2)]
[im 4/34]
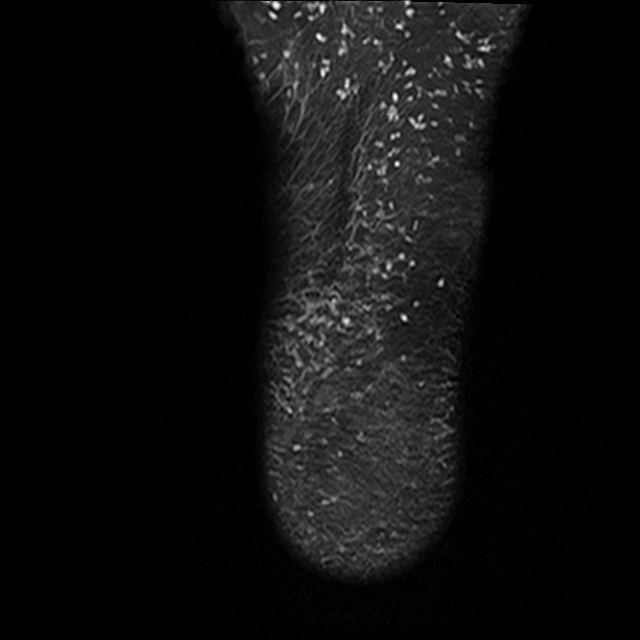
[im 19/34]
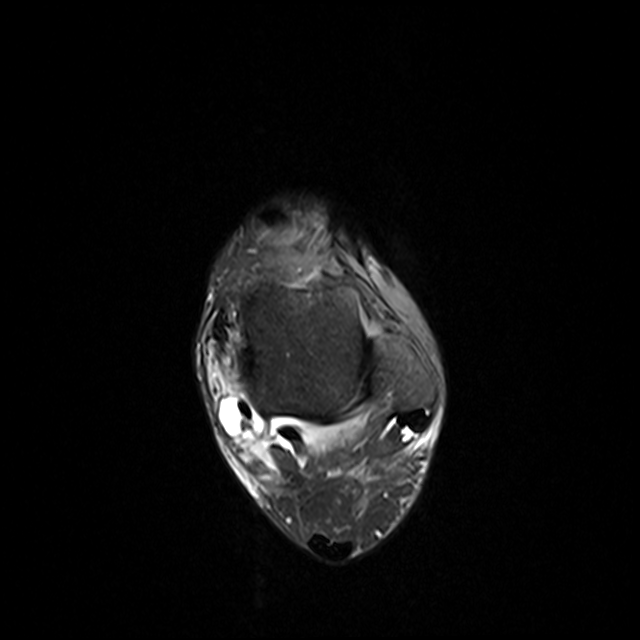
[im 30/34]
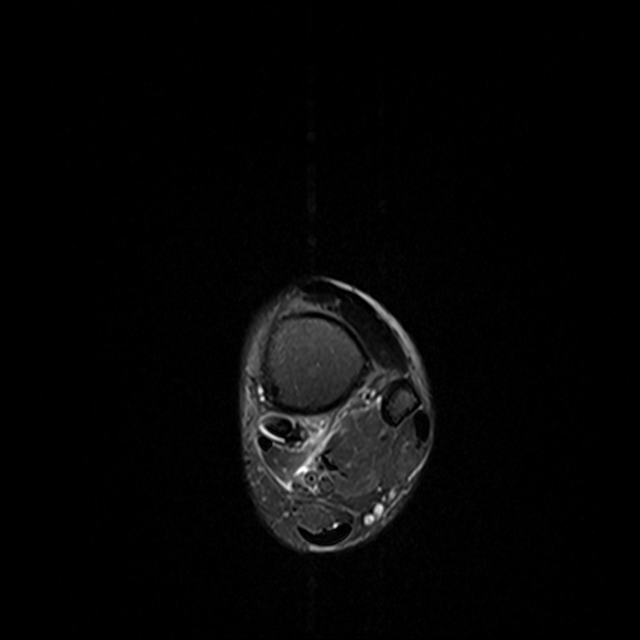

[Series 5: T1 · sagittal · left · 4.0mm · 0.27mm/px · 3 of 20 slices shown]
[im 1/20]
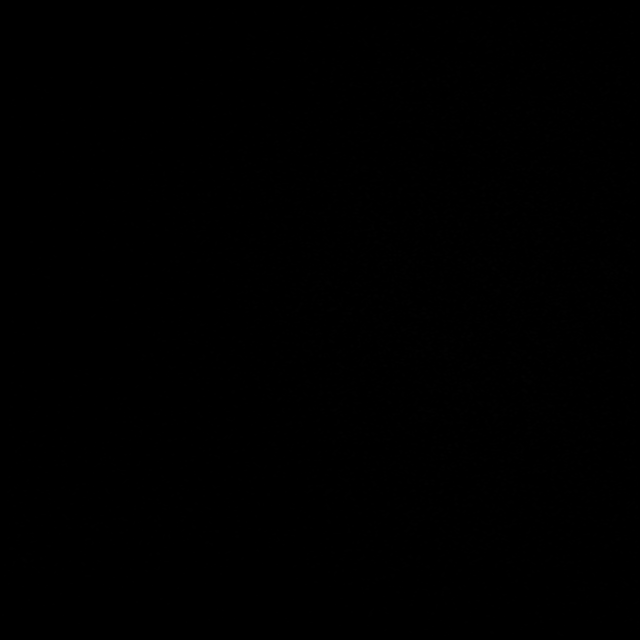
[im 10/20]
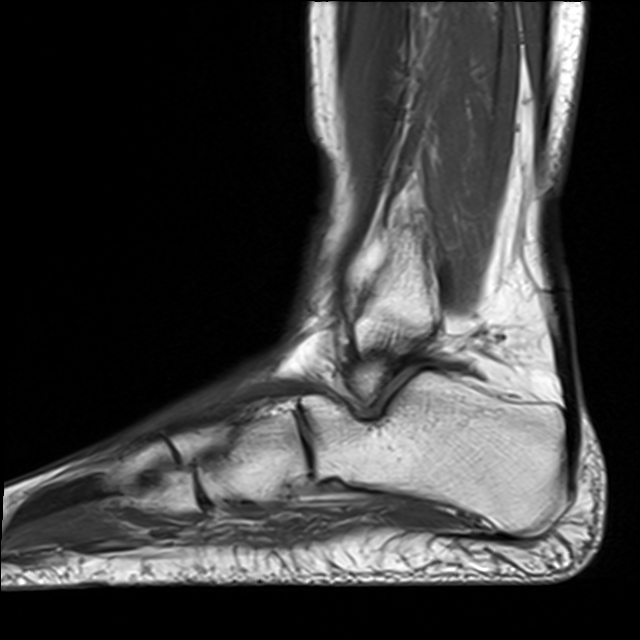
[im 20/20]
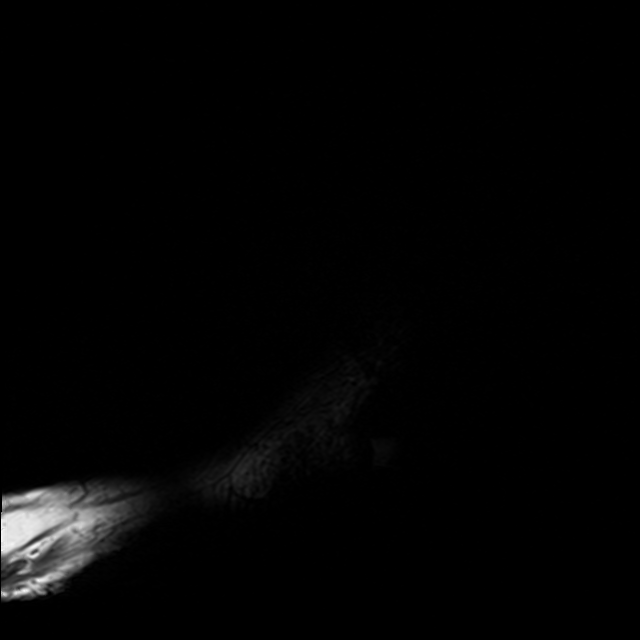

[Series 7: T2 fat-sat · coronal · left · 3.0mm · 0.25mm/px · 3 of 38 slices shown (2 of 2)]
[im 5/38]
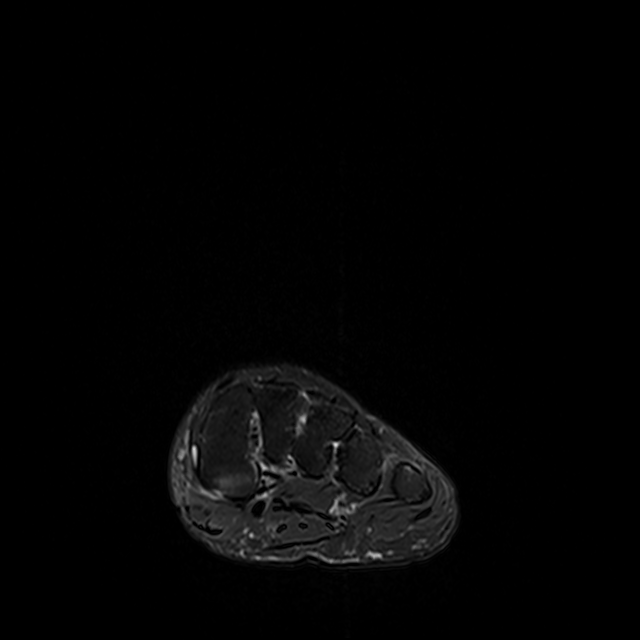
[im 21/38]
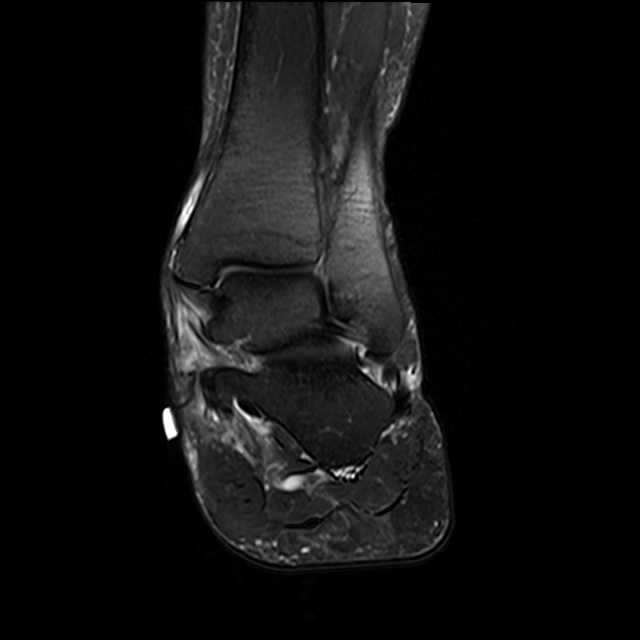
[im 33/38]
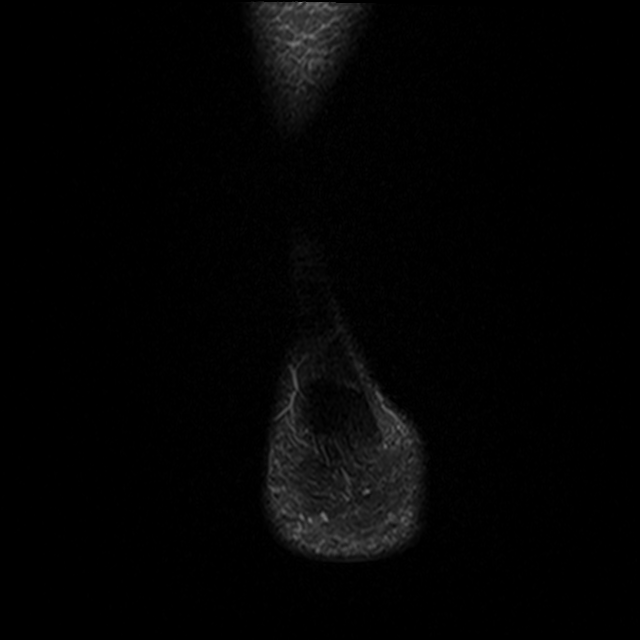

[12 of 40 positions shown; findings below may reference images not displayed]

FINDINGS: TENDONS

Peroneal: Peroneal longus tendon intact. Peroneal brevis intact.

Posteromedial: There is a complete midsubstance tear of the
posterior tibialis tendon at the level of the medial malleolus with
a gap of approximately 2 cm. There appears to be a portion of the
distal tendon intact at the level of the talus with increased
intrasubstance signal and extending to the insertion site. A small
amount of fluid is seen surrounding the flexor hallucis longus and
flexor digitorum tendons.

Anterior: Tibialis anterior tendon intact. Extensor hallucis longus
tendon intact Extensor digitorum longus tendon intact.

Achilles:  Intact.

Plantar Fascia: Intact.

LIGAMENTS

Lateral: Anterior talofibular ligament intact. Calcaneofibular
ligament intact. Posterior talofibular ligament intact. Anterior and
posterior tibiofibular ligaments intact.

Medial: Deltoid ligament intact. There is thickening with increased
signal of the spring ligament, however it is intact.

CARTILAGE

Ankle Joint: A small ankle joint effusion is seen. Normal ankle
mortise. No chondral defect.

Subtalar Joints/Sinus Tarsi: Normal subtalar joints. A small
subtalar joint effusion is seen. There is edema within the sinus
tarsi.

Bones: Increased signal seen within the mid medial aspect of the
talus. No definite osseous fracture however is noted.

Soft Tissue: Subcutaneous and soft tissue edema seen along the
medial aspect of the ankle.
IMPRESSION: 1. Focal complete midsubstance tear of the posterior tibialis tendon
at the level of the medial malleolus with a gap approximately 2 cm.
There appears to be a portion of the distal tendon intact to the
level of the insertion site.
2. Mild flexor hallucis longus and flexor digitorum tenosynovitis
3. Intrasubstance sprain of the spring ligament, however it is
intact.
4. Marrow edema/osseous contusion within the medial mid and anterior
talus. No osseous fracture
5. Small ankle subtalar joint effusion.

## 2023-02-11 ENCOUNTER — Ambulatory Visit
Admission: RE | Admit: 2023-02-11 | Discharge: 2023-02-11 | Disposition: A | Discharge status: Home / Self Care | Payer: BC Managed Care – PPO | Source: Ambulatory Visit | Attending: Medical | Admitting: Medical

## 2023-02-11 ENCOUNTER — Ambulatory Visit
Admission: RE | Admit: 2023-02-11 | Discharge: 2023-02-11 | Disposition: A | Discharge status: Home / Self Care | Payer: BC Managed Care – PPO | Source: Ambulatory Visit | Attending: Family Medicine | Admitting: Family Medicine

## 2023-02-11 DIAGNOSIS — E349 Endocrine disorder, unspecified: Secondary | ICD-10-CM

## 2023-02-11 DIAGNOSIS — Z1231 Encounter for screening mammogram for malignant neoplasm of breast: Secondary | ICD-10-CM | POA: Diagnosis not present

## 2023-02-11 DIAGNOSIS — E559 Vitamin D deficiency, unspecified: Secondary | ICD-10-CM

## 2023-02-11 DIAGNOSIS — N958 Other specified menopausal and perimenopausal disorders: Secondary | ICD-10-CM | POA: Diagnosis not present

## 2023-02-11 DIAGNOSIS — Z780 Asymptomatic menopausal state: Secondary | ICD-10-CM

## 2023-02-11 DIAGNOSIS — Z8781 Personal history of (healed) traumatic fracture: Secondary | ICD-10-CM

## 2023-02-12 NOTE — Progress Notes (Signed)
Results sent through MyChart

## 2023-04-22 DIAGNOSIS — F4322 Adjustment disorder with anxiety: Secondary | ICD-10-CM | POA: Diagnosis not present

## 2023-04-23 DIAGNOSIS — N951 Menopausal and female climacteric states: Secondary | ICD-10-CM | POA: Diagnosis not present

## 2023-04-23 DIAGNOSIS — Z79890 Hormone replacement therapy (postmenopausal): Secondary | ICD-10-CM | POA: Diagnosis not present

## 2023-04-23 DIAGNOSIS — R5382 Chronic fatigue, unspecified: Secondary | ICD-10-CM | POA: Diagnosis not present

## 2023-04-23 DIAGNOSIS — E559 Vitamin D deficiency, unspecified: Secondary | ICD-10-CM | POA: Diagnosis not present

## 2023-04-30 DIAGNOSIS — F4322 Adjustment disorder with anxiety: Secondary | ICD-10-CM | POA: Diagnosis not present

## 2023-04-30 DIAGNOSIS — M25561 Pain in right knee: Secondary | ICD-10-CM | POA: Diagnosis not present

## 2023-05-05 DIAGNOSIS — F4322 Adjustment disorder with anxiety: Secondary | ICD-10-CM | POA: Diagnosis not present

## 2023-06-02 DIAGNOSIS — F4322 Adjustment disorder with anxiety: Secondary | ICD-10-CM | POA: Diagnosis not present

## 2023-06-08 DIAGNOSIS — L821 Other seborrheic keratosis: Secondary | ICD-10-CM | POA: Diagnosis not present

## 2023-06-08 DIAGNOSIS — L820 Inflamed seborrheic keratosis: Secondary | ICD-10-CM | POA: Diagnosis not present

## 2023-06-08 DIAGNOSIS — D225 Melanocytic nevi of trunk: Secondary | ICD-10-CM | POA: Diagnosis not present

## 2023-06-08 DIAGNOSIS — L814 Other melanin hyperpigmentation: Secondary | ICD-10-CM | POA: Diagnosis not present

## 2023-06-08 DIAGNOSIS — L578 Other skin changes due to chronic exposure to nonionizing radiation: Secondary | ICD-10-CM | POA: Diagnosis not present

## 2023-06-09 DIAGNOSIS — M25561 Pain in right knee: Secondary | ICD-10-CM | POA: Diagnosis not present

## 2023-06-24 DIAGNOSIS — F4322 Adjustment disorder with anxiety: Secondary | ICD-10-CM | POA: Diagnosis not present

## 2023-07-01 DIAGNOSIS — F4322 Adjustment disorder with anxiety: Secondary | ICD-10-CM | POA: Diagnosis not present

## 2023-07-06 DIAGNOSIS — M2341 Loose body in knee, right knee: Secondary | ICD-10-CM | POA: Diagnosis not present

## 2023-07-06 DIAGNOSIS — X58XXXA Exposure to other specified factors, initial encounter: Secondary | ICD-10-CM | POA: Diagnosis not present

## 2023-07-06 DIAGNOSIS — S83271A Complex tear of lateral meniscus, current injury, right knee, initial encounter: Secondary | ICD-10-CM | POA: Diagnosis not present

## 2023-07-06 DIAGNOSIS — T8484XA Pain due to internal orthopedic prosthetic devices, implants and grafts, initial encounter: Secondary | ICD-10-CM | POA: Diagnosis not present

## 2023-07-06 DIAGNOSIS — Y999 Unspecified external cause status: Secondary | ICD-10-CM | POA: Diagnosis not present

## 2023-07-06 DIAGNOSIS — S83281A Other tear of lateral meniscus, current injury, right knee, initial encounter: Secondary | ICD-10-CM | POA: Diagnosis not present

## 2023-07-06 DIAGNOSIS — T849XXA Unspecified complication of internal orthopedic prosthetic device, implant and graft, initial encounter: Secondary | ICD-10-CM | POA: Diagnosis not present

## 2023-07-08 DIAGNOSIS — F4322 Adjustment disorder with anxiety: Secondary | ICD-10-CM | POA: Diagnosis not present

## 2023-07-09 DIAGNOSIS — S83206D Unspecified tear of unspecified meniscus, current injury, right knee, subsequent encounter: Secondary | ICD-10-CM | POA: Diagnosis not present

## 2023-07-14 DIAGNOSIS — S83206D Unspecified tear of unspecified meniscus, current injury, right knee, subsequent encounter: Secondary | ICD-10-CM | POA: Diagnosis not present

## 2023-07-22 DIAGNOSIS — F4322 Adjustment disorder with anxiety: Secondary | ICD-10-CM | POA: Diagnosis not present

## 2023-07-23 DIAGNOSIS — S83206D Unspecified tear of unspecified meniscus, current injury, right knee, subsequent encounter: Secondary | ICD-10-CM | POA: Diagnosis not present

## 2023-08-03 DIAGNOSIS — Z79890 Hormone replacement therapy (postmenopausal): Secondary | ICD-10-CM | POA: Diagnosis not present

## 2023-08-03 DIAGNOSIS — N951 Menopausal and female climacteric states: Secondary | ICD-10-CM | POA: Diagnosis not present

## 2023-08-03 DIAGNOSIS — E559 Vitamin D deficiency, unspecified: Secondary | ICD-10-CM | POA: Diagnosis not present

## 2023-08-03 DIAGNOSIS — R6882 Decreased libido: Secondary | ICD-10-CM | POA: Diagnosis not present

## 2023-08-04 DIAGNOSIS — F4322 Adjustment disorder with anxiety: Secondary | ICD-10-CM | POA: Diagnosis not present

## 2023-08-04 DIAGNOSIS — S83206D Unspecified tear of unspecified meniscus, current injury, right knee, subsequent encounter: Secondary | ICD-10-CM | POA: Diagnosis not present

## 2023-08-07 DIAGNOSIS — S83206D Unspecified tear of unspecified meniscus, current injury, right knee, subsequent encounter: Secondary | ICD-10-CM | POA: Diagnosis not present

## 2023-08-10 ENCOUNTER — Ambulatory Visit (INDEPENDENT_AMBULATORY_CARE_PROVIDER_SITE_OTHER): Payer: BC Managed Care – PPO | Admitting: Medical

## 2023-08-10 VITALS — BP 120/70 | HR 59 | Ht 63.75 in | Wt 118.8 lb

## 2023-08-10 DIAGNOSIS — R7301 Impaired fasting glucose: Secondary | ICD-10-CM | POA: Diagnosis not present

## 2023-08-10 DIAGNOSIS — Z0000 Encounter for general adult medical examination without abnormal findings: Secondary | ICD-10-CM | POA: Diagnosis not present

## 2023-08-10 DIAGNOSIS — E7801 Familial hypercholesterolemia: Secondary | ICD-10-CM

## 2023-08-10 DIAGNOSIS — Z8249 Family history of ischemic heart disease and other diseases of the circulatory system: Secondary | ICD-10-CM

## 2023-08-10 DIAGNOSIS — Z131 Encounter for screening for diabetes mellitus: Secondary | ICD-10-CM

## 2023-08-10 DIAGNOSIS — Z13220 Encounter for screening for lipoid disorders: Secondary | ICD-10-CM

## 2023-08-10 DIAGNOSIS — Z7185 Encounter for immunization safety counseling: Secondary | ICD-10-CM

## 2023-08-10 DIAGNOSIS — Z23 Encounter for immunization: Secondary | ICD-10-CM

## 2023-08-10 DIAGNOSIS — E349 Endocrine disorder, unspecified: Secondary | ICD-10-CM

## 2023-08-10 DIAGNOSIS — E559 Vitamin D deficiency, unspecified: Secondary | ICD-10-CM

## 2023-08-10 LAB — LIPID PANEL

## 2023-08-10 NOTE — Progress Notes (Signed)
Subjective:   HPI  Kristina Newton is a 56 y.o. female who presents for Chief Complaint  Patient presents with   Annual Exam    Fasting cpe, no concerns.     Patient Care Team: Joud Ingwersen, Cleda Mccreedy as PCP - General (Family Medicine)  Medical team: Dr. Francia Greaves, wound care/gen surgery Dr. Marlow Baars, gynecology Dr. Ramond Marrow, orthopedics Dr. Cristie Hem, podiatry Dr. Zoila Shutter, cardiology Dr. Erick Blinks, GI Sees dentist Sees eye doctor Dr. Sharyn Lull, Dermatology specialists   Concerns: Here for well visit.  She has been doing well lately compared to last year.  She is on hormone therapy and sees a hormone specialist.  She is on topical testosterone therapy, oral estradiol and progesterone.  She sees a hormone specialist every 3 months and gets labs every 3 months.  Doing well with this treatment right now.  She was on higher dose of estrogen but had some vaginal bleeding.  This resolved after the dose was lowered.  History of vitamin D deficiency-currently using vitamin D 4000 units daily.  She was increasing last year and doing okay on this dose.    She had a CT coronary test last year showing score of 0.  She is against using statins but will entertain Zetia or other treatment.  She wants to recheck some of her lipid labs.  She would like to do something like the [facility] diagnostics NMR LipoProfile  She had history of leg injury in March 2022.  Back in November of this year 2024 she had the metal pieces taken out of her right leg and has been doing great ever since.  She had a quick recovery.  She continues to exercise with stationary bike, will resistance training, able to walk a faster pace now since the therapy and surgery in November 2024.  She was doing triathlons prior to her injury.  Since being on hormone therapy last year she is not having problems with belly pain or indigestion.  She cut out alcohol except occasional drink.  Acidic white wine  definitely causes her problems.  We will consider shingles vaccine maybe in the spring 2025.  Reviewed their medical, surgical, family, social, medication, and allergy history and updated chart as appropriate.  Past Medical History:  Diagnosis Date   Arthritis    Cervical dysplasia    Fibrocystic breast disease    History of cervical dysplasia 06/2010   s/p LEEP ,  CIN 2   History of COVID-19 09/18/2020   per pt mild symptoms that resolved   PONV (postoperative nausea and vomiting)    Vitamin D deficiency 2020   Wears contact lenses    right eye only    Family History  Problem Relation Age of Onset   Cancer Mother        basal cell cancer   Hearing loss Mother    Macular degeneration Mother    Dementia Mother    Cancer Father        basal cell cancer   Hypertension Father    Hyperlipidemia Father    Heart disease Father        arrythmia, stent/CAD?   Depression Sister    Stroke Neg Hx    Diabetes Neg Hx    Colon polyps Neg Hx    Colon cancer Neg Hx    Breast cancer Neg Hx      Current Outpatient Medications:    estradiol (ESTRACE) 0.1 MG/GM vaginal cream, Place 1 Applicatorful vaginally.  Every 3 days, Disp: , Rfl:    estradiol (VIVELLE-DOT) 0.025 MG/24HR, 1 patch 2 (two) times a week., Disp: , Rfl:    NON FORMULARY, PLEASE APPLY 2 CLICKS (=0.5 GRAM) TO INNER THIGH OR BACK OF KNEE MONDAY THROUGH FRIDAY, OFF ON WEEKENDS. BE CAREFUL TO AVOID TRANSFER TO OTH, Disp: , Rfl:    progesterone (PROMETRIUM) 100 MG capsule, Take 100 mg by mouth at bedtime., Disp: , Rfl:    tretinoin (RETIN-A) 0.025 % cream, SMARTSIG:Topical Every Evening, Disp: , Rfl:    vitamin B-12 (CYANOCOBALAMIN) 1000 MCG tablet, Take 1,000 mcg by mouth daily., Disp: , Rfl:   Allergies  Allergen Reactions   Macrobid [Nitrofurantoin]     vomiting   Benadryl Itch Stopping [Diphenhydramine Hcl] Rash    topical      Review of Systems Constitutional: -fever, -chills, -sweats, -unexpected weight  change, -decreased appetite, -fatigue Allergy: -sneezing, -itching, -congestion Dermatology: -changing moles, --rash, -lumps ENT: -runny nose, -ear pain, -sore throat, -hoarseness, -sinus pain, -teeth pain, - ringing in ears, -hearing loss, -nosebleeds Cardiology: -chest pain, -palpitations, -swelling, -difficulty breathing when lying flat, -waking up short of breath Respiratory: -cough, -shortness of breath, -difficulty breathing with exercise or exertion, -wheezing, -coughing up blood Gastroenterology: -abdominal pain, -nausea, -vomiting, -diarrhea, -constipation, -blood in stool, -changes in bowel movement, -difficulty swallowing or eating Hematology: -bleeding, -bruising  Musculoskeletal: -joint aches, -muscle aches, -joint swelling, -back pain, -neck pain, -cramping, -changes in gait Ophthalmology: denies vision changes, eye redness, itching, discharge Urology: -burning with urination, -difficulty urinating, -blood in urine, -urinary frequency, -urgency, -incontinence Neurology: -headache, -weakness, -tingling, -numbness, -memory loss, -falls, -dizziness Psychology: -depressed mood, -agitation, -sleep problems Breast/gyn: -breast tendnerss, -discharge, -lumps, -vaginal discharge,- irregular periods, -heavy periods      08/10/2023   10:27 AM 08/08/2022   10:32 AM 08/06/2021    8:21 AM 06/30/2019   12:06 PM 02/05/2016   12:12 PM  Depression screen PHQ 2/9  Decreased Interest 0 0 0 0 0  Down, Depressed, Hopeless 0 0 0 0 0  PHQ - 2 Score 0 0 0 0 0       Objective:  BP 120/70   Pulse (!) 59   Ht 5' 3.75" (1.619 m)   Wt 118 lb 12.8 oz (53.9 kg)   LMP 12/30/2014   BMI 20.55 kg/m   Wt Readings from Last 3 Encounters:  08/10/23 118 lb 12.8 oz (53.9 kg)  08/08/22 118 lb 12.8 oz (53.9 kg)  09/07/21 117 lb 15.1 oz (53.5 kg)    General appearance: alert, no distress, WD/WN, Caucasian female Skin: unremarkable HEENT: normocephalic, conjunctiva/corneas normal, sclerae anicteric,  PERRLA, EOMi Neck: supple, no lymphadenopathy, no thyromegaly, no masses, normal ROM, no bruits Chest: non tender, normal shape and expansion Heart: RRR, normal S1, S2, no murmurs Lungs: CTA bilaterally, no wheezes, rhonchi, or rales Abdomen: +bs, soft, non tender, non distended, no masses, no hepatomegaly, no splenomegaly, no bruits Back: non tender, no scoliosis Musculoskeletal: surgical scar right lateral knee and lower leg, linear, limited exam, unremarkable  Extremities: no edema, no cyanosis, no clubbing Pulses: 2+ symmetric, upper and lower extremities, normal cap refill Neurological: alert, oriented x 3, CN2-12 intact, strength normal upper extremities and lower extremities, sensation normal throughout, DTRs 2+ throughout, no cerebellar signs, gait normal Psychiatric: normal affect, behavior normal, pleasant  Breast/gyn/rectal - deferred to gynecology     Assessment and Plan :   Encounter Diagnoses  Name Primary?   Encounter for health maintenance examination in adult Yes  Needs flu shot    Familial hypercholesteremia    Family history of heart disease    Impaired fasting blood sugar    Hormone disturbance    Screening for diabetes mellitus    Screening for lipid disorders    Vitamin D deficiency    Vaccine counseling     This visit was a preventative care visit, also known as wellness visit or routine physical.   Topics typically include healthy lifestyle, diet, exercise, preventative care, vaccinations, sick and well care, proper use of emergency dept and after hours care, as well as other concerns.     Recommendations: Continue to return yearly for your annual wellness and preventative care visits.  This gives [place] a chance to discuss healthy lifestyle, exercise, vaccinations, review your chart record, and perform screenings where appropriate.  I recommend you see your eye doctor yearly for routine vision care.  I recommend you see your dentist yearly for routine  dental care including hygiene visits twice yearly.   Vaccination recommendations were reviewed Immunization History  Administered Date(s) Administered   Hepatitis B, ADULT 03/28/2015, 04/30/2015, 02/05/2016   Influenza Split 05/26/2007, 05/31/2013, 06/16/2014, 04/18/2021   Influenza, Seasonal, Injecte, Preservative Fre 08/10/2023   Influenza,inj,Quad PF,6+ Mos 06/11/2017, 04/27/2019, 04/24/2021, 05/17/2022   Influenza-Unspecified 06/01/2014, 05/15/2018, 04/27/2019   MMR 12/01/2006, 07/15/2010   Moderna Covid-19 Vaccine Bivalent Booster 91yrs & up 05/14/2021   Moderna SARS-COV2 Booster Vaccination 06/18/2020   Moderna Sars-Covid-2 Vaccination 10/17/2019, 11/14/2019, 01/19/2020   PPD Test 09/24/2007, 08/23/2008, 06/24/2010, 07/14/2011, 08/23/2012, 09/06/2013, 10/23/2014, 02/05/2016   Td 08/06/2021   Tdap 09/05/2010   Consider the shingles vaccine  Counseled on the influenza virus vaccine.  Vaccine information sheet given.  Influenza vaccine given after consent obtained.   Screening for cancer: Colon cancer screening: I reviewed your colonoscopy on file that is up to date from 06/2018   Breast cancer screening: You should perform a self breast exam monthly.   We reviewed recommendations for regular mammograms and breast cancer screening.  I reviewed up to date mammogram.  Cervical cancer screening: Continue routine pap smears with gynecology   Skin cancer screening: Check your skin regularly for new changes, growing lesions, or other lesions of concern Come in for evaluation if you have skin lesions of concern.  Lung cancer screening: If you have a greater than 20 pack year history of tobacco use, then you may qualify for lung cancer screening with a chest CT scan.   Please call your insurance company to inquire about coverage for this test.  We currently don't have screenings for other cancers besides breast, cervical, colon, and lung cancers.  If you have a strong family  history of cancer or have other cancer screening concerns, please let me know.    Bone health: Get at least 150 minutes of aerobic exercise weekly Get weight bearing exercise at least once weekly Bone density test:  A bone density test is an imaging test that uses a type of X-ray to measure the amount of calcium and other minerals in your bones. The test may be used to diagnose or screen you for a condition that causes weak or thin bones (osteoporosis), predict your risk for a broken bone (fracture), or determine how well your osteoporosis treatment is working. The bone density test is recommended for females 65 and older, or females or males <65 if certain risk factors such as thyroid disease, long term use of steroids such as for asthma or rheumatological issues, vitamin D deficiency,  estrogen deficiency, family history of osteoporosis, self or family history of fragility fracture in first degree relative.  Bone density reviewed from 01/2023 that shows osteopenia   Heart health: Get at least 150 minutes of aerobic exercise weekly Limit alcohol It is important to maintain a healthy blood pressure and healthy cholesterol numbers  Heart disease screening: Screening for heart disease includes screening for blood pressure, fasting lipids, glucose/diabetes screening, BMI height to weight ratio, reviewed of smoking status, physical activity, and diet.    Goals include blood pressure 120/80 or less, maintaining a healthy lipid/cholesterol profile, preventing diabetes or keeping diabetes numbers under good control, not smoking or using tobacco products, exercising most days per week or at least 150 minutes per week of exercise, and eating healthy variety of fruits and vegetables, healthy oils, and avoiding unhealthy food choices like fried food, fast food, high sugar and high cholesterol foods.    CT coronary score of 0 January 2024  Medical care options: I recommend you continue to seek care here  first for routine care.  We try really hard to have available appointments Monday through Friday daytime hours for sick visits, acute visits, and physicals.  Urgent care should be used for after hours and weekends for significant issues that cannot wait till the next day.  The emergency department should be used for significant potentially life-threatening emergencies.  The emergency department is expensive, can often have long wait times for less significant concerns, so try to utilize primary care, urgent care, or telemedicine when possible to avoid unnecessary trips to the emergency department.  Virtual visits and telemedicine have been introduced since the pandemic started in 2020, and can be convenient ways to receive medical care.  We offer virtual appointments as well to assist you in a variety of options to seek medical care.   Separate significant issues discussed:  Vit D deficiency-I reviewed her lab work from/16/24 and September 2024.  Her vitamin D has improved to 38 since being on 4000 units daily.  This is increased from 29 back in September  Hyperlipidemia, family history of high cholesterol - updated labs today as requested below  Impaired glucose on prior labs - updated labs today  Hormone disturbance - she feels significantly better now being on hormone therapy    Kristina Newton was seen today for annual exam.  Diagnoses and all orders for this visit:  Encounter for health maintenance examination in adult -     Apolipoprotein A-1 -     Apolipoprotein B -     High sensitivity CRP -     Insulin, random -     Lipoprotein A (LPA) -     Lipid panel -     Hemoglobin A1c -     Comprehensive metabolic panel  Needs flu shot -     Flu vaccine trivalent PF, 6mos and older(Flulaval,Afluria,Fluarix,Fluzone)  Familial hypercholesteremia -     Apolipoprotein A-1 -     Apolipoprotein B -     High sensitivity CRP -     Insulin, random -     Lipoprotein A (LPA) -     Lipid panel -      Hemoglobin A1c  Family history of heart disease  Impaired fasting blood sugar -     Hemoglobin A1c  Hormone disturbance  Screening for diabetes mellitus -     Insulin, random -     Hemoglobin A1c  Screening for lipid disorders -     Apolipoprotein A-1 -  Apolipoprotein B -     High sensitivity CRP -     Insulin, random -     Lipoprotein A (LPA) -     Lipid panel  Vitamin D deficiency  Vaccine counseling    Follow-up pending labs, yearly for physical

## 2023-08-11 NOTE — Progress Notes (Signed)
Results sent through MyChart

## 2023-08-13 ENCOUNTER — Other Ambulatory Visit: Payer: Self-pay | Admitting: Medical

## 2023-08-13 DIAGNOSIS — S83206D Unspecified tear of unspecified meniscus, current injury, right knee, subsequent encounter: Secondary | ICD-10-CM | POA: Diagnosis not present

## 2023-08-13 LAB — COMPREHENSIVE METABOLIC PANEL
ALT: 9 IU/L (ref 0–32)
AST: 17 IU/L (ref 0–40)
Albumin: 4.5 g/dL (ref 3.8–4.9)
Alkaline Phosphatase: 43 IU/L — ABNORMAL LOW (ref 44–121)
BUN/Creatinine Ratio: 10 (ref 9–23)
BUN: 11 mg/dL (ref 6–24)
Bilirubin Total: 0.4 mg/dL (ref 0.0–1.2)
CO2: 23 mmol/L (ref 20–29)
Calcium: 9.2 mg/dL (ref 8.7–10.2)
Chloride: 102 mmol/L (ref 96–106)
Creatinine, Ser: 1.05 mg/dL — ABNORMAL HIGH (ref 0.57–1.00)
Globulin, Total: 2.1 g/dL (ref 1.5–4.5)
Glucose: 87 mg/dL (ref 70–99)
Potassium: 4.7 mmol/L (ref 3.5–5.2)
Sodium: 139 mmol/L (ref 134–144)
Total Protein: 6.6 g/dL (ref 6.0–8.5)
eGFR: 61 mL/min/{1.73_m2} (ref >59)

## 2023-08-13 LAB — INSULIN, RANDOM: INSULIN: 3.7 u[IU]/mL (ref 2.6–24.9)

## 2023-08-13 LAB — LIPID PANEL
Cholesterol, Total: 261 mg/dL — ABNORMAL HIGH (ref 100–199)
HDL: 69 mg/dL (ref >39)
LDL CALC COMMENT:: 3.8 ratio (ref 0.0–4.4)
LDL Chol Calc (NIH): 176 mg/dL — ABNORMAL HIGH (ref 0–99)
Triglycerides: 95 mg/dL (ref 0–149)
VLDL Cholesterol Cal: 16 mg/dL (ref 5–40)

## 2023-08-13 LAB — APOLIPOPROTEIN A-1: Apolipoprotein A-1: 182 mg/dL (ref 116–209)

## 2023-08-13 LAB — APOLIPOPROTEIN B: Apolipoprotein B: 121 mg/dL — ABNORMAL HIGH (ref <90)

## 2023-08-13 LAB — HIGH SENSITIVITY CRP: CRP, High Sensitivity: 0.39 mg/L (ref 0.00–3.00)

## 2023-08-13 LAB — HEMOGLOBIN A1C
Est. average glucose Bld gHb Est-mCnc: 111 mg/dL
Hgb A1c MFr Bld: 5.5 % (ref 4.8–5.6)

## 2023-08-13 LAB — LIPOPROTEIN A (LPA): Lipoprotein (a): 208.9 nmol/L — ABNORMAL HIGH (ref <75.0)

## 2023-08-13 MED ORDER — EZETIMIBE 10 MG PO TABS: 10.0000 mg | ORAL_TABLET | Freq: Every day | ORAL | 0 refills | Status: DC

## 2023-08-27 DIAGNOSIS — F4322 Adjustment disorder with anxiety: Secondary | ICD-10-CM | POA: Diagnosis not present

## 2023-09-01 ENCOUNTER — Institutional Professional Consult (permissible substitution): Payer: BC Managed Care – PPO | Admitting: Medical

## 2023-09-18 DIAGNOSIS — F4322 Adjustment disorder with anxiety: Secondary | ICD-10-CM | POA: Diagnosis not present

## 2023-10-29 DIAGNOSIS — F4322 Adjustment disorder with anxiety: Secondary | ICD-10-CM | POA: Diagnosis not present

## 2023-12-07 ENCOUNTER — Telehealth: Payer: Self-pay | Admitting: Medical

## 2023-12-07 MED ORDER — EZETIMIBE 10 MG PO TABS
10.0000 mg | ORAL_TABLET | Freq: Every day | ORAL | 0 refills | Status: DC
Start: 2023-12-07 — End: 2024-02-22

## 2023-12-07 NOTE — Telephone Encounter (Signed)
 [facility]  Ezetimbe  10 mg #90

## 2023-12-07 NOTE — Telephone Encounter (Signed)
 done

## 2023-12-23 DIAGNOSIS — N9412 Deep dyspareunia: Secondary | ICD-10-CM | POA: Diagnosis not present

## 2023-12-23 DIAGNOSIS — Z79890 Hormone replacement therapy (postmenopausal): Secondary | ICD-10-CM | POA: Diagnosis not present

## 2023-12-23 DIAGNOSIS — R5382 Chronic fatigue, unspecified: Secondary | ICD-10-CM | POA: Diagnosis not present

## 2023-12-23 DIAGNOSIS — R635 Abnormal weight gain: Secondary | ICD-10-CM | POA: Diagnosis not present

## 2023-12-23 DIAGNOSIS — N951 Menopausal and female climacteric states: Secondary | ICD-10-CM | POA: Diagnosis not present

## 2023-12-25 DIAGNOSIS — F4322 Adjustment disorder with anxiety: Secondary | ICD-10-CM | POA: Diagnosis not present

## 2024-01-14 ENCOUNTER — Other Ambulatory Visit: Payer: Self-pay | Admitting: Family Medicine

## 2024-01-14 DIAGNOSIS — Z1231 Encounter for screening mammogram for malignant neoplasm of breast: Secondary | ICD-10-CM

## 2024-02-10 DIAGNOSIS — Z124 Encounter for screening for malignant neoplasm of cervix: Secondary | ICD-10-CM | POA: Diagnosis not present

## 2024-02-10 DIAGNOSIS — Z01419 Encounter for gynecological examination (general) (routine) without abnormal findings: Secondary | ICD-10-CM | POA: Diagnosis not present

## 2024-02-10 DIAGNOSIS — Z6821 Body mass index (BMI) 21.0-21.9, adult: Secondary | ICD-10-CM | POA: Diagnosis not present

## 2024-02-15 ENCOUNTER — Ambulatory Visit
Admission: RE | Admit: 2024-02-15 | Discharge: 2024-02-15 | Disposition: A | Discharge status: Home / Self Care | Source: Ambulatory Visit | Attending: Family Medicine | Admitting: Family Medicine

## 2024-02-15 DIAGNOSIS — Z1231 Encounter for screening mammogram for malignant neoplasm of breast: Secondary | ICD-10-CM

## 2024-02-21 ENCOUNTER — Other Ambulatory Visit: Payer: Self-pay | Admitting: Medical

## 2024-02-23 ENCOUNTER — Other Ambulatory Visit: Payer: Self-pay | Admitting: Medical

## 2024-02-23 DIAGNOSIS — E7801 Familial hypercholesterolemia: Secondary | ICD-10-CM

## 2024-02-23 NOTE — Telephone Encounter (Signed)
 Ludie, does she need an appt or just a lab visit?  Copied from CRM [id number]. Topic: Clinical - Request for Lab/Test Order >> Feb 23, 2024  3:00 PM Berwyn MATSU wrote: Reason for CRM: Lipid panel labs blood needed since patient is on medication ezetimibe  (ZETIA ) 10 MG tablet and would like to know if the medication is working.   May you please assist.

## 2024-03-01 ENCOUNTER — Other Ambulatory Visit

## 2024-03-01 DIAGNOSIS — E7801 Familial hypercholesterolemia: Secondary | ICD-10-CM

## 2024-03-01 LAB — LIPID PANEL
Chol/HDL Ratio: 3.3 ratio (ref 0.0–4.4)
Cholesterol, Total: 190 mg/dL (ref 100–199)
HDL: 57 mg/dL (ref >39)
LDL Chol Calc (NIH): 119 mg/dL — ABNORMAL HIGH (ref 0–99)
Triglycerides: 79 mg/dL (ref 0–149)
VLDL Cholesterol Cal: 14 mg/dL (ref 5–40)

## 2024-03-01 LAB — ALT: ALT: 8 IU/L (ref 0–32)

## 2024-03-02 ENCOUNTER — Other Ambulatory Visit: Payer: Self-pay | Admitting: Medical

## 2024-03-02 ENCOUNTER — Ambulatory Visit: Payer: Self-pay | Admitting: Medical

## 2024-03-02 MED ORDER — EZETIMIBE 10 MG PO TABS: 10.0000 mg | ORAL_TABLET | Freq: Every day | ORAL | 2 refills | Status: DC

## 2024-03-02 NOTE — Progress Notes (Signed)
 Results sent through MyChart

## 2024-03-15 DIAGNOSIS — F4322 Adjustment disorder with anxiety: Secondary | ICD-10-CM | POA: Diagnosis not present

## 2024-03-23 ENCOUNTER — Other Ambulatory Visit: Payer: Self-pay | Admitting: Medical

## 2024-05-05 ENCOUNTER — Encounter: Payer: Self-pay | Admitting: Family Medicine

## 2024-05-05 ENCOUNTER — Ambulatory Visit: Admitting: Family Medicine

## 2024-05-05 ENCOUNTER — Ambulatory Visit (INDEPENDENT_AMBULATORY_CARE_PROVIDER_SITE_OTHER): Admitting: Family Medicine

## 2024-05-05 VITALS — BP 124/74 | HR 60 | Temp 99.2°F | Ht 63.0 in | Wt 121.6 lb

## 2024-05-05 DIAGNOSIS — R509 Fever, unspecified: Secondary | ICD-10-CM

## 2024-05-05 DIAGNOSIS — J029 Acute pharyngitis, unspecified: Secondary | ICD-10-CM

## 2024-05-05 LAB — POCT RAPID STREP A (OFFICE): Rapid Strep A Screen: NEGATIVE

## 2024-05-05 NOTE — Patient Instructions (Signed)
 ACUTE LEFT-SIDED SORE THROAT AND ORAL APHTHOUS ULCER (right cheek): You have a sore throat on the left side and a small ulcer in your mouth on the right cheek. The strep test was negative, and it is likely caused by a virus. -Consider repeating the COVID antigen test at home if your symptoms persist.   LOW GRADE FEVER AND LEFT-SIDED CONGESTION: You have a low-grade fever and congestion on the left side, likely due to a viral infection. -Consider repeating the COVID antigen test at home if your symptoms persist.  Postnasal drainage might be contributing to the left sided sore throat. Consider using an antihistamine or decongestant (sudafed). If you end up with feeling more postnasal drainage, or if any cough develops, use mucinex.  Stay well hydrated. Contact us  if you have persistent fever or changes in symptoms.

## 2024-05-05 NOTE — Progress Notes (Signed)
 Chief Complaint  Patient presents with   Sore Throat    Started yesterday 1pm with ST. Covid tested yesterday and today, both negative.    Kristina Newton is a 58 year old female who presents with a left sided sore throat since yesterday, and a white spot on the right cheek.  Symptoms began at 1 PM the previous day with a generalized sore throat, later localizing to the left side. Pain was severe enough to wake her at 1 AM, leading her to take 1000 mg of Tylenol . She has not taken other medications since.  She denies recent use of steroid inhalers or antibiotics. The sore throat is described as slightly worse than initially but not as severe as the pain that woke her last night.. Swallowing is slightly painful.  No congestion, cough, or significant fatigue. She feels slightly warm but has not measured a fever. She has not exercised since symptom onset, though she exercised vigorously in the days prior.   She performed COVID-19 tests yesterday, and this morning, both negative.  She denies sick contacts. She is in and out of medical offices.  She is headed to [place] tomorrow to visit with her son, and her daughter is flying from [place] to meet her there.    PMH, PSH, SH reviewed  Outpatient Encounter Medications as of 05/05/2024  Medication Sig Note   Acetaminophen  (TYLENOL  PO) Take 1,000 mg by mouth as needed. 05/05/2024: Last dose 1000mg  1am   estradiol  (ESTRACE ) 0.1 MG/GM vaginal cream Place 1 Applicatorful vaginally. Every 3 days    estradiol  (VIVELLE -DOT) 0.025 MG/24HR 1 patch 2 (two) times a week.    ezetimibe  (ZETIA ) 10 MG tablet TAKE 1 TABLET DAILY    Magnesium 200 MG TABS Take 200 mg by mouth daily.    NON FORMULARY PLEASE APPLY 2 CLICKS (=0.5 GRAM) TO INNER THIGH OR BACK OF KNEE MONDAY THROUGH FRIDAY, OFF ON WEEKENDS. BE CAREFUL TO AVOID TRANSFER TO OTH    progesterone  (PROMETRIUM ) 100 MG capsule Take 100 mg by mouth at bedtime.    tretinoin (RETIN-A) 0.025 % cream  SMARTSIG:Topical Every Evening    [DISCONTINUED] vitamin B-12 (CYANOCOBALAMIN ) 1000 MCG tablet Take 1,000 mcg by mouth daily.    No facility-administered encounter medications on file as of 05/05/2024.   Allergies  Allergen Reactions   Macrobid [Nitrofurantoin]     vomiting   Benadryl Itch Stopping [Diphenhydramine Hcl] Rash    topical    ROS: no chills, headache, runny nose, ear pain, cough, fatigue, myalgias, bleeding, bruising or rash. Sore throat and white spot on cheek per HPI No n/v/d.    PHYSICAL EXAM:  BP 124/74   Pulse 60   Temp 99.2 F (37.3 C) (Tympanic)   Ht 5' 3 (1.6 m)   Wt 121 lb 9.6 oz (55.2 kg)   LMP 12/30/2014   BMI 21.54 kg/m   Wt Readings from Last 3 Encounters:  05/05/24 121 lb 9.6 oz (55.2 kg)  08/10/23 118 lb 12.8 oz (53.9 kg)  08/08/22 118 lb 12.8 oz (53.9 kg)   Pleasant, well-appearing female in no distress HEENT: conjunctiva and sclera are clear, EOMI. TM's and EAC's normal. Nasal mucosa is mildly edematous, L>R, without erythema or any drainage.  Sinuses nontender. OP--on the right cheek there is an area of erythema with white central ulcer (this was swabbed by nurse, so not sure if it was red prior to visit). There is erythema of the posterior OP and bilateral anterior tonsillar pillars. No exudates  are present. Tonsils appear normal. Neck: no lymphadenopathy, thyromegaly or mass Heart: regular rate and rhythm, no murmur Lungs: clear bilaterally Back: no CVA tenderness Extremities: no edema Skin: no rashes, normal turgor   Negative rapid strep test    ASSESSMENT/PLAN:  Sore throat - L sided; diffuse erythema.  Suspect viral origin.  Other URI sx might develop. Supportive measures reviewed. Repeat COVID test 1-2d - Plan: Rapid Strep A  Fever in adult - tylenol  prn pain and/or fever - Plan: Rapid Strep A  Acute left-sided sore throat and oral aphthous ulcer Left-sided sore throat right cheek lesion, likely aphthous ulcer. Strep test  negative. Viral etiology likely. - Consider repeating COVID antigen test at home in 1-2 days. Offered PCR test in office, declined. She will cancel her trip if COVID +. Reviewed supportive measures for symptoms--decongestant, mucinex prn if develops increasing congestion, PND, cough (especially to use sudafed prior to flying).  Low grade fever and left-sided congestion.  DDx reviewed, including HFM syndrome, viral URI including COVID vs other etiologies, just early on without other symptoms yet (starting with sore throat).  All questions answered

## 2024-05-10 DIAGNOSIS — N760 Acute vaginitis: Secondary | ICD-10-CM | POA: Diagnosis not present

## 2024-05-10 DIAGNOSIS — Z113 Encounter for screening for infections with a predominantly sexual mode of transmission: Secondary | ICD-10-CM | POA: Diagnosis not present

## 2024-05-12 ENCOUNTER — Telehealth: Payer: Self-pay | Admitting: Internal Medicine

## 2024-05-12 MED ORDER — MNEXSPIKE 10 MCG/0.2ML IM SUSY: 0.2000 mL | PREFILLED_SYRINGE | Freq: Once | INTRAMUSCULAR | 0 refills | Status: AC

## 2024-05-12 NOTE — Telephone Encounter (Signed)
 Copied from CRM #[id number]. Topic: Clinical - Medication Question >> May 12, 2024 11:49 AM Myrick T wrote: Reason for CRM: patient called to get a script for the Moderna Covid vaccine sent to [id number] [place], [place] - [address] AT [facility] OF [address] [facility] [address]  Phone: [phone] Fax: [phone]

## 2024-06-12 ENCOUNTER — Encounter

## 2024-06-13 ENCOUNTER — Encounter: Payer: Self-pay | Admitting: Medical

## 2024-06-13 ENCOUNTER — Ambulatory Visit: Admitting: Medical

## 2024-06-13 VITALS — BP 120/80 | HR 72 | Wt 117.4 lb

## 2024-06-13 DIAGNOSIS — A09 Infectious gastroenteritis and colitis, unspecified: Secondary | ICD-10-CM | POA: Diagnosis not present

## 2024-06-13 MED ORDER — AZITHROMYCIN 500 MG PO TABS: 500.0000 mg | ORAL_TABLET | Freq: Every day | ORAL | 0 refills | Status: DC

## 2024-06-13 NOTE — Progress Notes (Signed)
 Subjective: Chief Complaint  Patient presents with   Acute Visit    Travelers diarrhea. diarrhea started on 16 th, every day sense.    Here for c/o diarrhea.  She experienced watery diarrhea that began during her trip to [place] from October 16th to October 21st. 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 The diarrhea persisted daily during her trip and continued after her return on October 22nd.  Upon returning, she experienced watery diarrhea three times on the morning of October 25th and again on October 26th. On October 27th today, she had one episode in the morning. She has lost a total of five pounds since the onset of symptoms, noting her weight was initially 120 pounds.  She has been consuming minimal food, including bananas and bread, and has been hydrating with electrolytes. No blood in the stool, fever, or foul odor. She experienced severe cramping initially, which has since subsided to occasional mild cramping.  She took precautions during her trip, such as drinking bottled water and avoiding street food, but notes that everyone in her tour group became ill. She mentions consuming raw vegetables washed in local water, which may have contributed to her symptoms.  Her current medications include loperamide as needed for diarrhea. She has not taken any antibiotics yet, unlike some of her fellow travelers who used ciprofloxacin.  No other aggravating or relieving factors. No other complaint.  ROS as in subjective    Objective: BP 120/80   Pulse 72   Wt 117 lb 6.4 oz (53.3 kg)   LMP 12/30/2014   SpO2 97%   BMI 20.80 kg/m    Gen: wd, wn, nad Abdomen: +mildly increased bs, soft, nonntender, no mass, no organomegaly    Assessment: Encounter Diagnosis  Name Primary?   Traveler's diarrhea Yes     Plan: Acute traveler's diarrhea Acute traveler's diarrhea post-[place] travel, symptoms reduced to 1-3  episodes/day. No blood or fever. Conservative management preferred. - Encourage hydration and electrolyte intake. - Recommend Restora probiotic. - Advise against loperamide unless diarrhea worsens to 5+ episodes/day. - Prescribe azithromycin 500 mg daily for 3 days if no improvement by Wednesday in 48 hours. - Advise dietary modifications to avoid spicy, fried, or gaseous foods. - Discussed GI stool profile option if symptoms persist or worsen.   Aeva was seen today for acute visit.  Diagnoses and all orders for this visit:  Traveler's diarrhea  Other orders -     azithromycin (ZITHROMAX) 500 MG tablet; Take 1 tablet (500 mg total) by mouth daily.    F/u prn

## 2024-06-13 NOTE — Telephone Encounter (Signed)
Got patient on schedule

## 2024-06-28 DIAGNOSIS — D485 Neoplasm of uncertain behavior of skin: Secondary | ICD-10-CM | POA: Diagnosis not present

## 2024-06-28 DIAGNOSIS — L578 Other skin changes due to chronic exposure to nonionizing radiation: Secondary | ICD-10-CM | POA: Diagnosis not present

## 2024-06-28 DIAGNOSIS — L821 Other seborrheic keratosis: Secondary | ICD-10-CM | POA: Diagnosis not present

## 2024-06-28 DIAGNOSIS — L820 Inflamed seborrheic keratosis: Secondary | ICD-10-CM | POA: Diagnosis not present

## 2024-06-28 DIAGNOSIS — D235 Other benign neoplasm of skin of trunk: Secondary | ICD-10-CM | POA: Diagnosis not present

## 2024-06-28 DIAGNOSIS — D225 Melanocytic nevi of trunk: Secondary | ICD-10-CM | POA: Diagnosis not present

## 2024-06-28 DIAGNOSIS — L814 Other melanin hyperpigmentation: Secondary | ICD-10-CM | POA: Diagnosis not present

## 2024-06-30 DIAGNOSIS — N952 Postmenopausal atrophic vaginitis: Secondary | ICD-10-CM | POA: Diagnosis not present

## 2024-06-30 DIAGNOSIS — N951 Menopausal and female climacteric states: Secondary | ICD-10-CM | POA: Diagnosis not present

## 2024-08-22 ENCOUNTER — Encounter: Payer: BC Managed Care – PPO | Admitting: Medical

## 2024-08-25 ENCOUNTER — Encounter: Admitting: Medical

## 2024-08-26 ENCOUNTER — Ambulatory Visit (INDEPENDENT_AMBULATORY_CARE_PROVIDER_SITE_OTHER): Payer: Self-pay | Admitting: Medical

## 2024-08-26 VITALS — BP 110/70 | HR 69 | Ht 63.5 in | Wt 117.6 lb

## 2024-08-26 DIAGNOSIS — E78019 Familial hypercholesterolemia, unspecified: Secondary | ICD-10-CM

## 2024-08-26 DIAGNOSIS — E559 Vitamin D deficiency, unspecified: Secondary | ICD-10-CM

## 2024-08-26 DIAGNOSIS — Z0000 Encounter for general adult medical examination without abnormal findings: Secondary | ICD-10-CM

## 2024-08-26 DIAGNOSIS — M8580 Other specified disorders of bone density and structure, unspecified site: Secondary | ICD-10-CM

## 2024-08-26 DIAGNOSIS — Z1211 Encounter for screening for malignant neoplasm of colon: Secondary | ICD-10-CM

## 2024-08-26 DIAGNOSIS — Z780 Asymptomatic menopausal state: Secondary | ICD-10-CM | POA: Diagnosis not present

## 2024-08-26 DIAGNOSIS — Z131 Encounter for screening for diabetes mellitus: Secondary | ICD-10-CM

## 2024-08-26 DIAGNOSIS — Z1389 Encounter for screening for other disorder: Secondary | ICD-10-CM

## 2024-08-26 LAB — MICROSCOPIC EXAMINATION

## 2024-08-26 LAB — HEMOGLOBIN A1C
Est. average glucose Bld gHb Est-mCnc: 105 mg/dL
Hgb A1c MFr Bld: 5.3 % (ref 4.8–5.6)

## 2024-08-26 NOTE — Progress Notes (Signed)
 "   Name: Kristina Newton   Date of Visit: 08/26/2024   Date of last visit with me: 06/13/2024   CHIEF COMPLAINT:  Chief Complaint  Patient presents with   Annual Exam    Fasting cpe, no concerns, up to date on vaccines. Will get pneumonia shot at work        HPI:  Discussed the use of AI scribe software for clinical note transcription with the patient, who gave verbal consent to proceed.  History of Present Illness  Patient Care Team: Konnor Jorden, Alm RAMAN, PA-C as PCP - General (Family Medicine) [place], Tawni CROME, MD as Referring Physician (Dermatology) Mat Browning, MD as Consulting Physician (Obstetrics and Gynecology) Eye doctor Dentist   [name] is a 59 year old female who presents for an annual physical exam.  She has no specific concerns but is experiencing a shortage of estradiol  patches, which she uses for hormone replacement therapy. She has one patch left for the upcoming week and is coordinating with her pharmacy to resolve the issue. Her hormone replacement therapy management has been transferred to a local gynecologist, who has conducted hormone level tests and is prescribing her medications.  Her current medications include Zetia , magnesium, estrogen, progesterone , and a compounded testosterone  cream at a dose of 5 mg per day, applied five to six days a week. She notes a significant drop in energy levels when not using the testosterone  cream.  She maintains an active lifestyle, exercising at least six times a week, including weight-bearing exercises up to three times a week, cycling, swimming, and walking. She reports eating well and is currently taking a vitamin D  supplement, specifically a vitamin D  K2 formulation at a dose of 5000 IU  vitamin D  per day  Her last colonoscopy was ten years ago. She had a DEXA scan in June 2024, which was around the time she started on her current medications.  She has received her first shingles vaccine  and plans to get the second one soon, despite experiencing significant arm pain after the first dose.  She expresses a general concern about her kidney health and is interested in monitoring her vitamin D  levels and lipid profile, particularly the effect of Zetia  on her ApoB levels.  Her mother, who is 81 years old, has dementia and lives in [place]. She visits her mother several times a year. No hearing concerns, and she feels good overall. Her weight is stable, and she feels good at her current weight.   Allergies[1]  Past Medical History:  Diagnosis Date   Arthritis    Cervical dysplasia    Fibrocystic breast disease    History of cervical dysplasia 06/2010   s/p LEEP ,  CIN 2   History of COVID-19 09/18/2020   per pt mild symptoms that resolved   PONV (postoperative nausea and vomiting)    Vitamin D  deficiency 2020   Wears contact lenses    right eye only    Current Medications[2]  Family History  Problem Relation Age of Onset   Cancer Mother        basal cell cancer   Hearing loss Mother    Macular degeneration Mother    Dementia Mother    Cancer Father        basal cell cancer   Hypertension Father    Hyperlipidemia Father    Heart disease Father        arrythmia, stent/CAD?   Depression Sister    Stroke Neg Hx  Diabetes Neg Hx    Colon polyps Neg Hx    Colon cancer Neg Hx    Breast cancer Neg Hx     Past Surgical History:  Procedure Laterality Date   ANTERIOR CRUCIATE LIGAMENT REPAIR Left 2018   AUGMENTATION MAMMAPLASTY Bilateral 06/2016   BREAST LUMPECTOMY W/ NEEDLE LOCALIZATION  03/2006   @[facility]    CERVICAL CONIZATION W/BX N/A 04/26/2021   Procedure: CONIZATION CERVIX WITH BIOPSY;  Surgeon: Gretta Gums, MD;  Location: [facility];  Service: Gynecology;  Laterality: N/A;   COLONOSCOPY  06/2015   normal, repeat 2026.  Dr. Gordy Starch   DIAGNOSTIC MAMMOGRAM  03/2010   normal   ORIF TIBIA PLATEAU Right 11/14/2020   Procedure: OPEN  REDUCTION INTERNAL FIXATION (ORIF) TIBIAL PLATEAU;  Surgeon: Cristy Bonner DASEN, MD;  Location: [facility];  Service: Orthopedics;  Laterality: Right;    Review of Systems  Constitutional:  Negative for chills, fever, malaise/fatigue and weight loss.  HENT:  Negative for congestion, ear pain, hearing loss, sore throat and tinnitus.   Eyes:  Negative for blurred vision, pain and redness.  Respiratory:  Negative for cough, hemoptysis and shortness of breath.   Cardiovascular:  Negative for chest pain, palpitations, orthopnea, claudication and leg swelling.  Gastrointestinal:  Negative for abdominal pain, blood in stool, constipation, diarrhea, nausea and vomiting.  Genitourinary:  Negative for dysuria, flank pain, frequency, hematuria and urgency.  Musculoskeletal:  Negative for falls, joint pain and myalgias.  Skin:  Negative for itching and rash.  Neurological:  Negative for dizziness, tingling, speech change, weakness and headaches.  Endo/Heme/Allergies:  Negative for polydipsia. Does not bruise/bleed easily.  Psychiatric/Behavioral:  Negative for depression and memory loss. The patient is not nervous/anxious and does not have insomnia.       OBJECTIVE:    BP 110/70   Pulse 69   Ht 5' 3.5 (1.613 m)   Wt 117 lb 9.6 oz (53.3 kg)   LMP 12/30/2014   SpO2 100%   BMI 20.51 kg/m   BP Readings from Last 3 Encounters:  08/26/24 110/70  06/13/24 120/80  05/05/24 124/74   Wt Readings from Last 3 Encounters:  08/26/24 117 lb 9.6 oz (53.3 kg)  06/13/24 117 lb 6.4 oz (53.3 kg)  05/05/24 121 lb 9.6 oz (55.2 kg)    Physical Exam  General appearence: alert, no distress, WD/WN, white female HEENT: normocephalic, sclerae anicteric, PERRLA, EOMi, nares patent, no discharge or erythema, pharynx normal Oral cavity: MMM, no lesions Neck: supple, no lymphadenopathy, no thyromegaly, no masses, no bruits Heart: RRR, normal S1, S2, no murmurs Lungs: CTA bilaterally, no wheezes,  rhonchi, or rales Abdomen: +bs, soft, non tender, non distended, no masses, no hepatomegaly, no splenomegaly Back: non tender Musculoskeletal: nontender, no swelling, no obvious deformity Extremities: no edema, no cyanosis, no clubbing Pulses: 2+ symmetric, upper and lower extremities, normal cap refill Neurological: alert, oriented x 3, CN2-12 intact, strength normal upper extremities and lower extremities, sensation normal throughout, DTRs 2+ throughout, no cerebellar signs, gait normal Psychiatric: normal affect, behavior normal, pleasant  Breast/GU - deferred to gynecology   ASSESSMENT/PLAN:   Encounter Diagnoses  Name Primary?   Encounter for health maintenance examination in adult Yes   Familial hypercholesterolemia, unspecified type    Vitamin D  deficiency    Screening for colon cancer    Postmenopausal estrogen deficiency    Osteopenia, unspecified location    Screening for hematuria or proteinuria    Screening for diabetes mellitus  Today you had a preventative care visit or wellness visit.    Topics today may have included healthy lifestyle, diet, exercise, preventative care, vaccinations, sick and well care, proper use of emergency dept and after hours care, as well as other concerns.     Recommendations: Continue to return yearly for your annual wellness and preventative care visits.  This gives us  a chance to discuss healthy lifestyle, exercise, vaccinations, review your chart record, and perform screenings where appropriate.  I recommend you see your eye doctor yearly for routine vision care.  I recommend you see your dentist yearly for routine dental care including hygiene visits twice yearly.   Vaccination recommendations were reviewed Immunization History  Administered Date(s) Administered   Hepatitis B, ADULT 03/28/2015, 04/30/2015, 02/05/2016   Influenza Split 05/26/2007, 05/31/2013, 06/16/2014, 04/18/2021   Influenza, Seasonal, Injecte, Preservative Fre  08/10/2023   Influenza,inj,Quad PF,6+ Mos 06/11/2017, 04/27/2019, 04/24/2021, 05/17/2022   Influenza-Unspecified 06/01/2014, 05/15/2018, 04/27/2019, 05/14/2024   MMR 12/01/2006, 07/15/2010   Moderna Covid-19 Vaccine Bivalent Booster 24yrs & up 05/14/2021   Moderna SARS-COV2 Booster Vaccination 06/18/2020   Moderna Sars-Covid-2 Vaccination 10/17/2019, 11/14/2019, 01/19/2020   PPD Test 09/24/2007, 08/23/2008, 06/24/2010, 07/14/2011, 08/23/2012, 09/06/2013, 10/23/2014, 02/05/2016   Pfizer(Comirnaty)Fall Seasonal Vaccine 12 years and older 05/14/2024   Td 08/06/2021   Tdap 09/05/2010   Zoster Recombinant(Shingrix) 08/04/2024   Zoster, Live 04/27/2014    She plans to get pneumococcal vaccine through her employer   She recently had the first Shingrix dose in 07/2024.  Screening for cancer: Breast cancer screening: Up to date on 2025 mammogram, normal, reviewed  Colon cancer screening:  We will refer you for screening colonoscopy, due this year 2026  Cervical cancer screening: We reviewed recommendations for pap smear screening. Up to date per gynecology  Skin cancer screening: Check your skin regularly for new changes, growing lesions, or other lesions of concern Come in for evaluation if you have skin lesions of concern.  Lung cancer screening: If you have a greater than 30 pack year history of tobacco use, then you qualify for lung cancer screening with a chest CT scan  We currently don't have screenings for other cancers besides breast, cervical, colon, and lung cancers.  If you have a strong family history of cancer or have other cancer screening concerns, please let me know.    Bone health: Continue your active lifestyle including aerobic exercise most days per week Continue weight bearing exercise as you are doing 2-3 days per week Repeat bone density 01/2025.   01/2023 study showed osteopenia.   Heart health: Get at least 150 minutes of aerobic exercise weekly Limit  alcohol It is important to maintain a healthy blood pressure and healthy cholesterol numbers   Separate significant issues discussed: Familial hypercholesterolemia Managed with Zetia . Interest in assessing ApoB levels to evaluate Zetia 's effectiveness.  -labs as below  Vitamin D  deficiency Recently she switched to vitamin D  K2 at 5000 IU daily. - Ordered vitamin D  level to assess current status.  Postmenopausal estrogen deficiency Managed with HRT including estradiol  patches and compounded testosterone  cream. Current shortage of estradiol  patches causing concern. -managed by gynecology  Osteopenia Monitored with DEXA scan. Last scan in June 2024, due for repeat this year. - Ordered DEXA scan for June 2026.    Alexee was seen today for annual exam.  Diagnoses and all orders for this visit:  Encounter for health maintenance examination in adult -     CBC -     Comprehensive metabolic  panel with GFR -     VITAMIN D  25 Hydroxy (Vit-D Deficiency, Fractures) -     Urinalysis, Routine w reflex microscopic -     Apolipoprotein B -     Lipoprotein A (LPA) -     Lipid panel -     Hemoglobin A1c  Familial hypercholesterolemia, unspecified type -     Apolipoprotein B -     Lipoprotein A (LPA) -     Lipid panel  Vitamin D  deficiency -     VITAMIN D  25 Hydroxy (Vit-D Deficiency, Fractures)  Screening for colon cancer -     Ambulatory referral to Gastroenterology  Postmenopausal estrogen deficiency -     DG Bone Density; Future  Osteopenia, unspecified location -     DG Bone Density; Future  Screening for hematuria or proteinuria -     Urinalysis, Routine w reflex microscopic  Screening for diabetes mellitus -     Hemoglobin A1c    Follow-up pending labs, yearly for physical         [facility]     [1]  Allergies Allergen Reactions   Macrobid [Nitrofurantoin]     vomiting   Benadryl Itch Stopping [Diphenhydramine Hcl]  Rash    topical  [2]  Current Outpatient Medications:    estradiol  (ESTRACE ) 0.1 MG/GM vaginal cream, Place 1 Applicatorful vaginally. Every 3 days, Disp: , Rfl:    estradiol  (VIVELLE -DOT) 0.025 MG/24HR, 1 patch 2 (two) times a week., Disp: , Rfl:    ezetimibe  (ZETIA ) 10 MG tablet, TAKE 1 TABLET DAILY, Disp: 30 tablet, Rfl: 5   Magnesium 200 MG TABS, Take 200 mg by mouth daily., Disp: , Rfl:    NON FORMULARY, , Disp: , Rfl:    NON FORMULARY, , Disp: , Rfl:    progesterone  (PROMETRIUM ) 100 MG capsule, Take 100 mg by mouth at bedtime., Disp: , Rfl:    TESTOSTERONE  COMPOUNDING KIT TD, Place 5 mg onto the skin daily., Disp: , Rfl:    tretinoin (RETIN-A) 0.025 % cream, SMARTSIG:Topical Every Evening, Disp: , Rfl:    Vitamin D -Vitamin K (VITAMIN K2-VITAMIN D3 PO), Take 5,000 Units by mouth daily., Disp: , Rfl:   "

## 2024-08-29 ENCOUNTER — Ambulatory Visit: Payer: Self-pay | Admitting: Medical

## 2024-08-29 LAB — MICROSCOPIC EXAMINATION
Casts: NONE SEEN
RBC, Urine: NONE SEEN /HPF (ref 0–2)
Renal Epithel, UA: NONE SEEN /LPF
WBC, UA: >30 /HPF — AB (ref 0–5)

## 2024-08-29 LAB — COMPREHENSIVE METABOLIC PANEL WITH GFR
ALT: 10 IU/L (ref 0–32)
AST: 18 IU/L (ref 0–40)
Albumin: 4.4 g/dL (ref 3.8–4.9)
Alkaline Phosphatase: 35 IU/L — ABNORMAL LOW (ref 49–135)
BUN/Creatinine Ratio: 14 (ref 12–28)
BUN: 13 mg/dL (ref 8–27)
Bilirubin Total: 0.3 mg/dL (ref 0.0–1.2)
CO2: 23 mmol/L (ref 20–29)
Calcium: 9.3 mg/dL (ref 8.7–10.3)
Chloride: 103 mmol/L (ref 96–106)
Creatinine, Ser: 0.95 mg/dL (ref 0.57–1.00)
Globulin, Total: 1.9 g/dL (ref 1.5–4.5)
Glucose: 90 mg/dL (ref 70–99)
Potassium: 4.5 mmol/L (ref 3.5–5.2)
Sodium: 139 mmol/L (ref 134–144)
Total Protein: 6.3 g/dL (ref 6.0–8.5)
eGFR: 69 mL/min/1.73

## 2024-08-29 LAB — CBC
Hematocrit: 40.6 % (ref 34.0–46.6)
Hemoglobin: 13.3 g/dL (ref 11.1–15.9)
MCH: 31.7 pg (ref 26.6–33.0)
MCHC: 32.8 g/dL (ref 31.5–35.7)
MCV: 97 fL (ref 79–97)
Platelets: 253 x10E3/uL (ref 150–450)
RBC: 4.2 x10E6/uL (ref 3.77–5.28)
RDW: 12.4 % (ref 11.7–15.4)
WBC: 4.6 x10E3/uL (ref 3.4–10.8)

## 2024-08-29 LAB — URINALYSIS, ROUTINE W REFLEX MICROSCOPIC
Bilirubin, UA: NEGATIVE
Glucose, UA: NEGATIVE
Ketones, UA: NEGATIVE
Nitrite, UA: NEGATIVE
Protein,UA: NEGATIVE
RBC, UA: NEGATIVE
Specific Gravity, UA: 1.017 (ref 1.005–1.030)
Urobilinogen, Ur: 0.2 mg/dL (ref 0.2–1.0)
pH, UA: 6 (ref 5.0–7.5)

## 2024-08-29 LAB — LIPOPROTEIN A (LPA): Lipoprotein (a): 196.4 nmol/L — AB

## 2024-08-29 LAB — LIPID PANEL
Chol/HDL Ratio: 3.1 ratio (ref 0.0–4.4)
Cholesterol, Total: 197 mg/dL (ref 100–199)
HDL: 63 mg/dL
LDL Chol Calc (NIH): 121 mg/dL — ABNORMAL HIGH (ref 0–99)
Triglycerides: 71 mg/dL (ref 0–149)
VLDL Cholesterol Cal: 13 mg/dL (ref 5–40)

## 2024-08-29 LAB — APOLIPOPROTEIN B: Apolipoprotein B: 98 mg/dL — AB

## 2024-08-29 LAB — VITAMIN D 25 HYDROXY (VIT D DEFICIENCY, FRACTURES): Vit D, 25-Hydroxy: 45 ng/mL (ref 30.0–100.0)

## 2024-08-29 NOTE — Progress Notes (Signed)
 Results through MyChart

## 2024-09-19 ENCOUNTER — Other Ambulatory Visit: Payer: Self-pay | Admitting: Medical

## 2025-08-28 ENCOUNTER — Encounter: Admitting: Medical
